# Patient Record
Sex: Female | Born: 1980 | Hispanic: Yes | Smoking: Never smoker
Health system: Southern US, Community
[De-identification: ages and names within clinical notes are randomized; demographics above are authoritative.]

## PROBLEM LIST (undated history)

## (undated) DIAGNOSIS — O1490 Unspecified pre-eclampsia, unspecified trimester: Secondary | ICD-10-CM

## (undated) DIAGNOSIS — D649 Anemia, unspecified: Secondary | ICD-10-CM

## (undated) HISTORY — DX: Anemia, unspecified: D64.9

## (undated) HISTORY — DX: Unspecified pre-eclampsia, unspecified trimester: O14.90

## (undated) HISTORY — PX: NO PAST SURGERIES: SHX2092

---
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
## 2004-04-18 ENCOUNTER — Emergency Department: Payer: Self-pay | Admitting: Emergency Medicine

## 2006-04-18 DIAGNOSIS — E282 Polycystic ovarian syndrome: Secondary | ICD-10-CM

## 2006-04-18 DIAGNOSIS — N926 Irregular menstruation, unspecified: Secondary | ICD-10-CM

## 2006-04-18 HISTORY — DX: Irregular menstruation, unspecified: N92.6

## 2006-04-18 HISTORY — DX: Polycystic ovarian syndrome: E28.2

## 2007-01-28 DIAGNOSIS — B009 Herpesviral infection, unspecified: Secondary | ICD-10-CM

## 2007-01-28 HISTORY — DX: Herpesviral infection, unspecified: B00.9

## 2008-08-23 ENCOUNTER — Inpatient Hospital Stay: Payer: Self-pay

## 2009-11-29 ENCOUNTER — Emergency Department: Payer: Self-pay | Admitting: Internal Medicine

## 2011-07-16 IMAGING — CR DG CHEST 1V PORT
1 series · 1 of 1 positions shown · non-contrast
Comparison: none

REASON FOR EXAM: cp
COMMENTS:

[view not recorded]
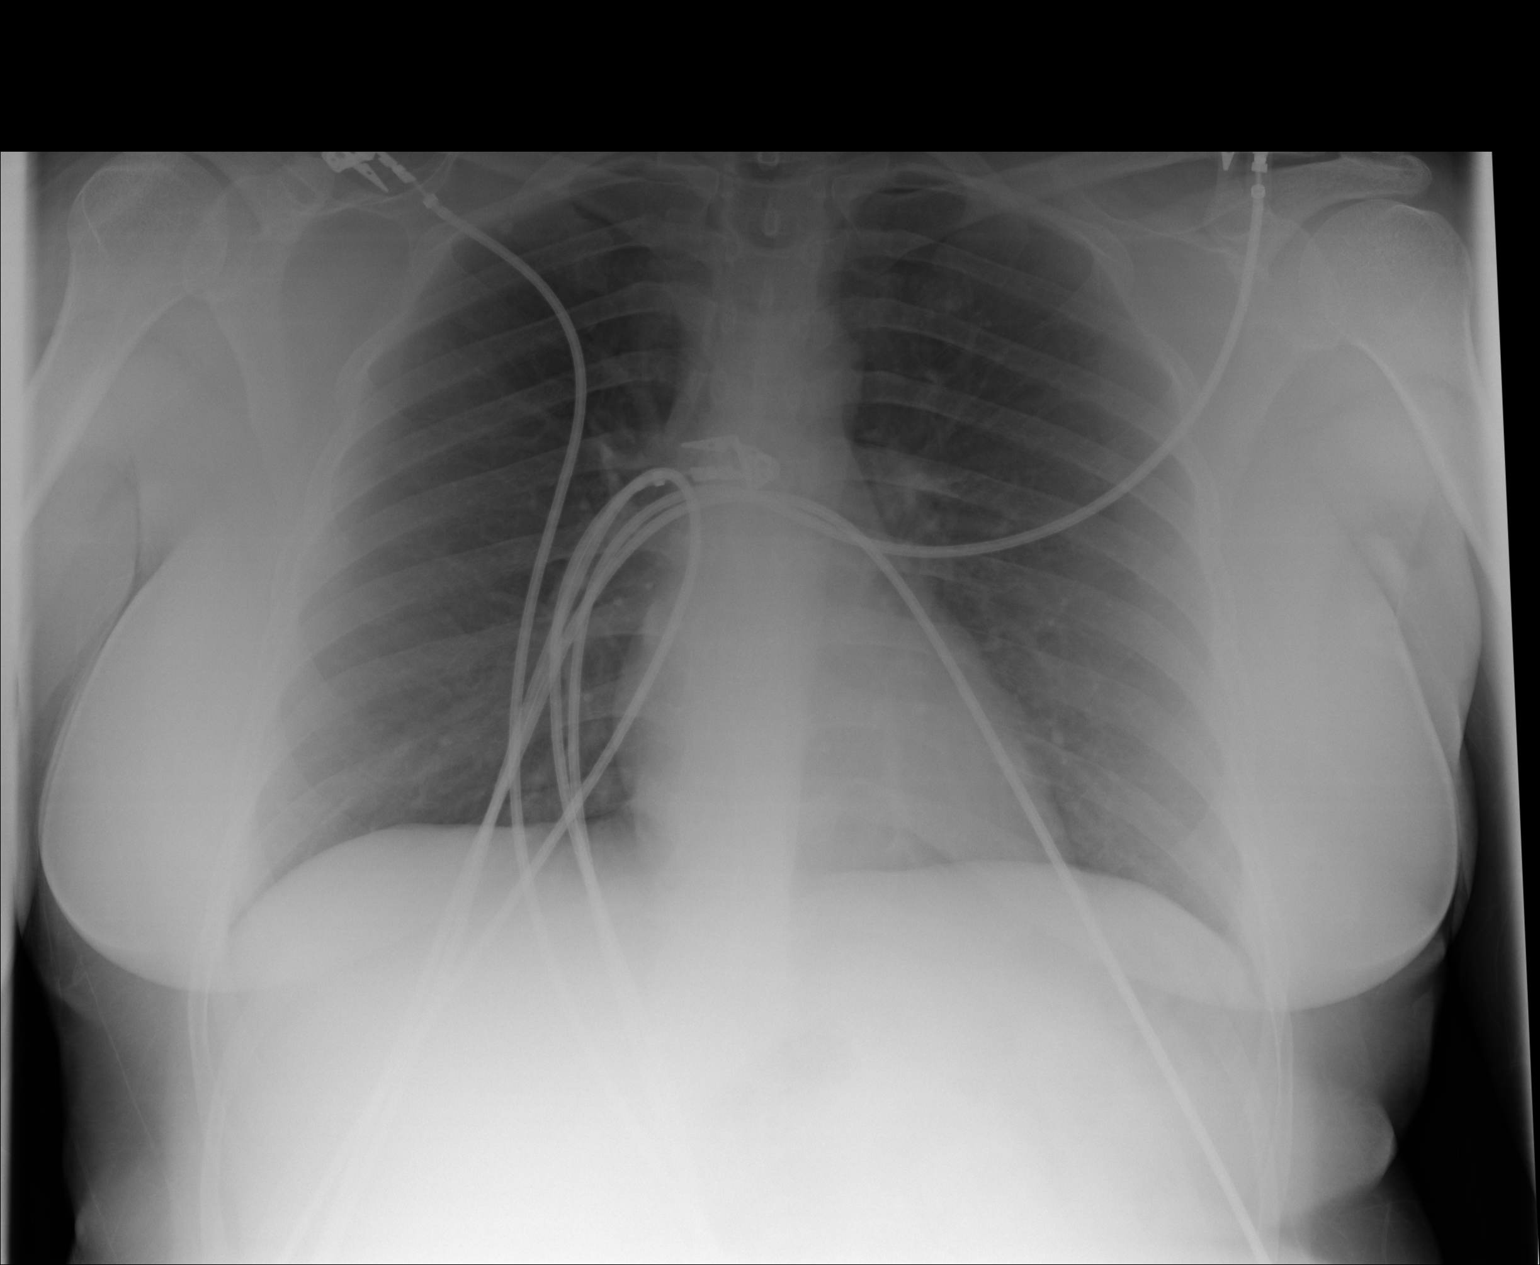

[1 of 1 positions shown; findings below may reference images not displayed]

PROCEDURE:     DXR - DXR PORTABLE CHEST SINGLE VIEW  - November 29, 2009  [DATE]

RESULT:     The lungs are adequately inflated. There is no focal infiltrate.
The mediastinum is not widened. The cardiac silhouette is normal in size.
The pulmonary vascularity is not engorged. There are cardiac monitoring
leads present.
IMPRESSION: I do not see evidence of acute cardiopulmonary abnormality.

## 2012-07-30 ENCOUNTER — Ambulatory Visit: Payer: Self-pay | Admitting: Nurse Practitioner

## 2012-10-05 ENCOUNTER — Encounter: Payer: Self-pay | Admitting: Maternal and Fetal Medicine

## 2012-10-26 ENCOUNTER — Encounter: Payer: Self-pay | Admitting: Obstetrics & Gynecology

## 2012-10-27 ENCOUNTER — Encounter: Payer: Self-pay | Admitting: Obstetrics & Gynecology

## 2013-03-02 ENCOUNTER — Inpatient Hospital Stay: Payer: Self-pay | Admitting: Obstetrics and Gynecology

## 2013-03-02 LAB — CBC WITH DIFFERENTIAL/PLATELET
Basophil #: 0.1 10*3/uL (ref 0.0–0.1)
HCT: 31.2 % — ABNORMAL LOW (ref 35.0–47.0)
Lymphocyte #: 1.8 10*3/uL (ref 1.0–3.6)
Lymphocyte %: 17.6 %
MCHC: 33.5 g/dL (ref 32.0–36.0)
MCV: 74 fL — ABNORMAL LOW (ref 80–100)
Monocyte #: 0.7 x10 3/mm (ref 0.2–0.9)
Monocyte %: 6.7 %
Neutrophil #: 7.6 10*3/uL — ABNORMAL HIGH (ref 1.4–6.5)
Neutrophil %: 74.3 %
Platelet: 293 10*3/uL (ref 150–440)
RBC: 4.22 10*6/uL (ref 3.80–5.20)
RDW: 14.7 % — ABNORMAL HIGH (ref 11.5–14.5)
WBC: 10.2 10*3/uL (ref 3.6–11.0)

## 2013-03-03 LAB — HEMOGLOBIN: HGB: 9.8 g/dL — ABNORMAL LOW (ref 12.0–16.0)

## 2014-06-08 DIAGNOSIS — E669 Obesity, unspecified: Secondary | ICD-10-CM

## 2014-06-08 HISTORY — DX: Obesity, unspecified: E66.9

## 2018-10-27 DIAGNOSIS — R7303 Prediabetes: Secondary | ICD-10-CM

## 2018-10-27 HISTORY — DX: Prediabetes: R73.03

## 2019-04-02 ENCOUNTER — Other Ambulatory Visit: Payer: Self-pay

## 2019-04-02 DIAGNOSIS — Z20822 Contact with and (suspected) exposure to covid-19: Secondary | ICD-10-CM

## 2019-04-03 LAB — NOVEL CORONAVIRUS, NAA: SARS-CoV-2, NAA: NOT DETECTED

## 2020-04-29 NOTE — L&D Delivery Note (Signed)
Delivery Note  Emily [name] is a [id number] at [redacted]w[redacted]d dated by ultrasound at [redacted]w[redacted]d  First Stage: Labor onset: 0330 Augmentation: oxytocin and AROM Analgesia /Anesthesia intrapartum: none AROM at 1345  Second Stage: Complete dilation at 1529 Onset of pushing at 1529 FHR second stage category 2 with repetitive small variables with quick return to baseline, moderate variability, overall reassuring  Delivery of a viable baby boy on 04/17/2021  at 1529 by Chari Manning CNM Delivery of fetal head in OA position with restitution to LOT. tight nuchal cord and body cord;  Anterior then posterior shoulders delivered easily with gentle downward traction. Baby placed on mom's chest, and attended to by baby RN Cord double clamped after cessation of pulsation, cut by sister of baby  Cord blood sample collection: Yes O POS Performed at [facility], [address] [place], [place] [address]  Collection of cord blood donation N/A Arterial cord blood sample no  Third Stage: Oxytocin bolus started after delivery of infant for hemorrhage prophylaxis  Placenta delivered [name] intact with 3 VC @ 1535 Placenta disposition: discarded Uterine tone firm / bleeding small  no laceration identified  Anesthesia for repair: N/A Repair N/A Est. Blood Loss (mL): 150  Complications: none  Mom to postpartum.  Baby to Couplet care / Skin to Skin.  Newborn: Information for the patient's newborn:  [name], [name] [[id number]]  Live born female  Birth Weight:   APGAR: 9, 9  Newborn Delivery   Birth date/time: 04/17/2021 15:29:00 Delivery type: Vaginal, Spontaneous        Feeding planned: Breast/Bottle  ---------- Chari Manning, CNM Certified Nurse Midwife [facility]

## 2020-05-08 DIAGNOSIS — E559 Vitamin D deficiency, unspecified: Secondary | ICD-10-CM

## 2020-05-08 HISTORY — DX: Vitamin D deficiency, unspecified: E55.9

## 2020-06-19 ENCOUNTER — Encounter: Payer: Self-pay | Admitting: Dermatology

## 2020-10-10 NOTE — Progress Notes (Signed)
Phone interview and OB abstract completed. Henriette Combs RN

## 2020-10-11 ENCOUNTER — Ambulatory Visit: Payer: Medicaid Other | Admitting: Advanced Practice Midwife

## 2020-10-11 ENCOUNTER — Other Ambulatory Visit: Payer: Self-pay

## 2020-10-11 VITALS — BP 108/73 | HR 71 | Temp 98.0°F | Ht 61.0 in | Wt 235.0 lb

## 2020-10-11 DIAGNOSIS — O0991 Supervision of high risk pregnancy, unspecified, first trimester: Secondary | ICD-10-CM

## 2020-10-11 DIAGNOSIS — O09291 Supervision of pregnancy with other poor reproductive or obstetric history, first trimester: Secondary | ICD-10-CM

## 2020-10-11 DIAGNOSIS — O161 Unspecified maternal hypertension, first trimester: Secondary | ICD-10-CM | POA: Diagnosis not present

## 2020-10-11 DIAGNOSIS — O99210 Obesity complicating pregnancy, unspecified trimester: Secondary | ICD-10-CM | POA: Insufficient documentation

## 2020-10-11 DIAGNOSIS — E282 Polycystic ovarian syndrome: Secondary | ICD-10-CM

## 2020-10-11 DIAGNOSIS — O09521 Supervision of elderly multigravida, first trimester: Secondary | ICD-10-CM

## 2020-10-11 DIAGNOSIS — O99211 Obesity complicating pregnancy, first trimester: Secondary | ICD-10-CM

## 2020-10-11 DIAGNOSIS — O09299 Supervision of pregnancy with other poor reproductive or obstetric history, unspecified trimester: Secondary | ICD-10-CM | POA: Insufficient documentation

## 2020-10-11 DIAGNOSIS — T7411XA Adult physical abuse, confirmed, initial encounter: Secondary | ICD-10-CM | POA: Insufficient documentation

## 2020-10-11 DIAGNOSIS — B009 Herpesviral infection, unspecified: Secondary | ICD-10-CM

## 2020-10-11 DIAGNOSIS — O0993 Supervision of high risk pregnancy, unspecified, third trimester: Secondary | ICD-10-CM | POA: Insufficient documentation

## 2020-10-11 DIAGNOSIS — O09529 Supervision of elderly multigravida, unspecified trimester: Secondary | ICD-10-CM | POA: Insufficient documentation

## 2020-10-11 LAB — WET PREP FOR TRICH, YEAST, CLUE
Trichomonas Exam: NEGATIVE
Yeast Exam: NEGATIVE

## 2020-10-11 LAB — URINALYSIS
Bilirubin, UA: NEGATIVE
Nitrite, UA: NEGATIVE
Protein,UA: NEGATIVE
RBC, UA: NEGATIVE
Specific Gravity, UA: 1.025 (ref 1.005–1.030)
Urobilinogen, Ur: 0.2 mg/dL (ref 0.2–1.0)
pH, UA: 6.5 (ref 5.0–7.5)

## 2020-10-11 LAB — HEMOGLOBIN, FINGERSTICK: Hemoglobin: 12.8 g/dL (ref 11.1–15.9)

## 2020-10-11 NOTE — Progress Notes (Signed)
Hgb = 12.8. Reviewed. Wet prep reviewed. WNL Urinalysis reviewed. No treatment indicated.   Reviewed with provider, Hazle Coca, CNM during clinic visit.   QTF gold lab not needed. PPD negative on 06/18/16 per paper chart. No international travel since.  Genetic counseling order received by Hazle Coca, CNM today and faxed today to [facility]FM genetic counseling.Fax confirmation received.    [name], RN

## 2020-10-11 NOTE — Progress Notes (Signed)
[facility] HEALTH DEPT [facility] [address] [place] [address] [phone]  INITIAL PRENATAL VISIT NOTE  Subjective:  Emily [name] is a 40 y.o. SHF nonsmoker G5P4004 ([id number], 7) at [redacted]w[redacted]d being seen today to start prenatal care at the [facility] Department. She feels "fine" about surprise pregnancy with no birth control x 8 years. She is not sure how old FOB is (36 or 10?); employed FOB feels "happy" about pregnancy; they share her 2 youngest children.  She is unsure of LMP ? 05/2020? She is not working and living with FOB, her 4 children, and her mother. She denies cigs, vaping, cigars, MJ. Last ETOH 04/22/20 (1 shot Tequila) 1x/mo. Began prenatal care at [facility] and has had 1 u/s this pregnancy on 09/06/20 at 7 1/7. Last pap 11/02/2018 neg.  2002 pap ASCUS. Denies ER use this pregnancy. She is currently monitored for the following issues for this high-risk pregnancy and has Advanced maternal age in multigravida 40 yo; Obesity affecting pregnancy BMI=44.4; Supervision of high risk pregnancy in first trimester; IUGR (intrauterine growth restriction) in prior pregnancy with IOL for IUGR 04/19/2000 5 lbs ; and Elevated blood pressure affecting pregnancy in first trimester on 08/24/20 137/79 on their problem list.  Patient reports no complaints.  Contractions: Not present. Vag. Bleeding: None.  Movement: Absent. Denies leaking of fluid.   Indications for ASA therapy (per uptodate) One of the following: Previous pregnancy with preeclampsia, especially early onset and with an adverse outcome No Multifetal gestation No Chronic hypertension No Type 1 or 2 diabetes mellitus No Chronic kidney disease No Autoimmune disease (antiphospholipid syndrome, systemic lupus erythematosus) No  Two or more of the following: Nulliparity No Obesity (body mass index >30 kg/m2) Yes Family history of preeclampsia in mother or sister  No Age ?35 years Yes Sociodemographic characteristics (African American race, low socioeconomic level) No Personal risk factors (eg, previous pregnancy with low birth weight or small for gestational age infant, previous adverse pregnancy outcome [eg, stillbirth], interval >10 years between pregnancies) Yes   The following portions of the patient's history were reviewed and updated as appropriate: allergies, current medications, past family history, past medical history, past social history, past surgical history and problem list. Problem list updated.  Objective:   Vitals:   10/11/20 0835 10/11/20 0856  BP:  108/73  Pulse:  71  Temp:  98 F (36.7 C)  Weight:  235 lb (106.6 kg)  Height: 5\' 1"  (1.549 m)     Fetal Status: Fetal Heart Rate (bpm): not heard Fundal Height: 0 cm Movement: Absent      Physical Exam Vitals and nursing note reviewed.  Constitutional:      General: She is not in acute distress.    Appearance: Normal appearance. She is well-developed. She is obese.  HENT:     Head: Normocephalic and atraumatic.     Right Ear: External ear normal.     Left Ear: External ear normal.     Nose: Nose normal. No congestion or rhinorrhea.     Mouth/Throat:     Lips: Pink.     Mouth: Mucous membranes are moist.     Dentition: Normal dentition. No dental caries.     Pharynx: Oropharynx is clear. Uvula midline.     Comments: Dentition: last dental exam 7 years ago; urged dental exam asap. No visible caries seen Eyes:     General: No scleral icterus.    Conjunctiva/sclera: Conjunctivae  normal.  Neck:     Thyroid: No thyroid mass or thyroid tenderness.  Cardiovascular:     Rate and Rhythm: Normal rate.     Pulses: Normal pulses.     Comments: Extremities are warm and well perfused Pulmonary:     Effort: Pulmonary effort is normal.     Breath sounds: Normal breath sounds.  Chest:     Chest wall: No mass.  Breasts:    Tanner Score is 5.     Breasts are symmetrical.      Right: Normal. No mass, nipple discharge, skin change or axillary adenopathy.     Left: Normal. No mass, nipple discharge, skin change or axillary adenopathy.  Abdominal:     Palpations: Abdomen is soft.     Tenderness: There is no abdominal tenderness. There is no right CVA tenderness, left CVA tenderness or guarding.     Comments: Gravid soft without masses or tenderness, increased adipose, poor tone, unable to assess uterine size and unable to hear FHR  Genitourinary:    General: Normal vulva.     Exam position: Lithotomy position.     Pubic Area: No rash.      Labia:        Right: No rash.        Left: No rash.      Vagina: Normal. No vaginal discharge (white creamy leukorrhea, ph<4.5).     Cervix: Normal.     Uterus: Normal. Enlarged (Gravid unable to assess size due to increased adipose). Not tender.      Rectum: Normal. No external hemorrhoid.     Comments: Unable to assess ovaries due to increased adipose Musculoskeletal:     Right lower leg: No edema.     Left lower leg: No edema.  Lymphadenopathy:     Cervical: No cervical adenopathy.     Upper Body:     Right upper body: No axillary adenopathy.     Left upper body: No axillary adenopathy.  Skin:    General: Skin is warm.     Capillary Refill: Capillary refill takes less than 2 seconds.  Neurological:     Mental Status: She is alert.    Assessment and Plan:  Pregnancy: G5P4004 at [redacted]w[redacted]d  1. Antepartum multigravida of advanced maternal age 80 yo Desires genetic counseling and Quad screen  2. Obesity affecting pregnancy, antepartum Early glucola today Desires ASA 81 mg daily to begin today Counseled on 11-20 lb wt gain in pregnancy - Glucose, 1 hour gestational - Hgb A1c w/o eAG - Comprehensive metabolic panel - Protein / creatinine ratio, urine  (Spot) - TSH  3. Supervision of high risk pregnancy in first trimester ROI for 03/2000 SGA infant--need correct gestational age   - HCV Ab w Reflex to Quant PCR -  Urine Culture - Chlamydia/GC NAA, Confirmation - [id number] Drug Screen - WET PREP FOR TRICH, YEAST, CLUE - Urinalysis (Urine Dip) - Hemoglobin, venipuncture - Prenatal profile without Varicella/Rubella ([id number]) - HIV-1/HIV-2 Qualitative RNA  4. IUGR (intrauterine growth restriction) in prior pregnancy with IOL for IUGR 04/19/2000 5 lbs    5. Elevated blood pressure affecting pregnancy in first trimester on 08/24/20 137/79 BP wnl today     Discussed overview of care and coordination with inpatient delivery practices including [facility], [facility], [facility] and [facility].   Reviewed Centering pregnancy as standard of care at [facility]   Preterm labor symptoms and general obstetric precautions including but not limited to vaginal bleeding, contractions, leaking of  fluid and fetal movement were reviewed in detail with the patient.  Please refer to After Visit Summary for other counseling recommendations.   No follow-ups on file.  No future appointments.  Alberteen Spindle, CNM

## 2020-10-12 ENCOUNTER — Telehealth: Payer: Self-pay

## 2020-10-12 LAB — 789231 7+OXYCODONE-BUND
Amphetamines, Urine: NEGATIVE ng/mL
BENZODIAZ UR QL: NEGATIVE ng/mL
Barbiturate screen, urine: NEGATIVE ng/mL
Cannabinoid Quant, Ur: NEGATIVE ng/mL
Cocaine (Metab.): NEGATIVE ng/mL
OPIATE SCREEN URINE: NEGATIVE ng/mL
Oxycodone/Oxymorphone, Urine: NEGATIVE ng/mL
PCP Quant, Ur: NEGATIVE ng/mL

## 2020-10-12 LAB — PROTEIN / CREATININE RATIO, URINE
Creatinine, Urine: 136.9 mg/dL
Protein, Ur: 10.6 mg/dL
Protein/Creat Ratio: 77 mg/g creat (ref 0–200)

## 2020-10-12 NOTE — Telephone Encounter (Signed)
Contacted patient to her know of upcoming appointment for genetic counseling [facility] on 6/23 at 0900. Patient aware to arrive at least 15 mins early to appointment.   Patient agreed with plan of care.   [name], RN

## 2020-10-13 LAB — CBC/D/PLT+RPR+RH+ABO+AB SCR
Antibody Screen: NEGATIVE
Basophils Absolute: 0 10*3/uL (ref 0.0–0.2)
Basos: 1 %
EOS (ABSOLUTE): 0.2 10*3/uL (ref 0.0–0.4)
Eos: 2 %
Hematocrit: 39.5 % (ref 34.0–46.6)
Hemoglobin: 12.9 g/dL (ref 11.1–15.9)
Hepatitis B Surface Ag: NEGATIVE
Immature Grans (Abs): 0 10*3/uL (ref 0.0–0.1)
Immature Granulocytes: 0 %
Lymphocytes Absolute: 1.8 10*3/uL (ref 0.7–3.1)
Lymphs: 23 %
MCH: 27.9 pg (ref 26.6–33.0)
MCHC: 32.7 g/dL (ref 31.5–35.7)
MCV: 86 fL (ref 79–97)
Monocytes Absolute: 0.3 10*3/uL (ref 0.1–0.9)
Monocytes: 4 %
Neutrophils Absolute: 5.3 10*3/uL (ref 1.4–7.0)
Neutrophils: 70 %
Platelets: 243 10*3/uL (ref 150–450)
RBC: 4.62 x10E6/uL (ref 3.77–5.28)
RDW: 13.6 % (ref 11.7–15.4)
RPR Ser Ql: NONREACTIVE
Rh Factor: POSITIVE
WBC: 7.6 10*3/uL (ref 3.4–10.8)

## 2020-10-13 LAB — HCV AB W REFLEX TO QUANT PCR: HCV Ab: <0.1 s/co ratio (ref 0.0–0.9)

## 2020-10-13 LAB — HIV-1/HIV-2 QUALITATIVE RNA
HIV-1 RNA, Qualitative: NONREACTIVE
HIV-2 RNA, Qualitative: NONREACTIVE

## 2020-10-13 LAB — COMPREHENSIVE METABOLIC PANEL
ALT: 11 IU/L (ref 0–32)
AST: 14 IU/L (ref 0–40)
Albumin/Globulin Ratio: 1.2 (ref 1.2–2.2)
Albumin: 3.6 g/dL — ABNORMAL LOW (ref 3.8–4.8)
Alkaline Phosphatase: 53 IU/L (ref 44–121)
BUN/Creatinine Ratio: 11 (ref 9–23)
BUN: 6 mg/dL (ref 6–20)
Bilirubin Total: 0.6 mg/dL (ref 0.0–1.2)
CO2: 20 mmol/L (ref 20–29)
Calcium: 8.9 mg/dL (ref 8.7–10.2)
Chloride: 100 mmol/L (ref 96–106)
Creatinine, Ser: 0.53 mg/dL — ABNORMAL LOW (ref 0.57–1.00)
Globulin, Total: 2.9 g/dL (ref 1.5–4.5)
Glucose: 139 mg/dL — ABNORMAL HIGH (ref 65–99)
Potassium: 3.5 mmol/L (ref 3.5–5.2)
Sodium: 135 mmol/L (ref 134–144)
Total Protein: 6.5 g/dL (ref 6.0–8.5)
eGFR: 121 mL/min/{1.73_m2} (ref >59)

## 2020-10-13 LAB — TSH: TSH: 0.998 u[IU]/mL (ref 0.450–4.500)

## 2020-10-13 LAB — HGB A1C W/O EAG: Hgb A1c MFr Bld: 5.7 % — ABNORMAL HIGH (ref 4.8–5.6)

## 2020-10-13 LAB — HCV INTERPRETATION

## 2020-10-13 LAB — GLUCOSE, 1 HOUR GESTATIONAL: Gestational Diabetes Screen: 134 mg/dL (ref 65–139)

## 2020-10-14 LAB — URINE CULTURE

## 2020-10-14 LAB — CHLAMYDIA/GC NAA, CONFIRMATION
Chlamydia trachomatis, NAA: NEGATIVE
Neisseria gonorrhoeae, NAA: NEGATIVE

## 2020-10-17 ENCOUNTER — Other Ambulatory Visit: Payer: Self-pay

## 2020-10-17 ENCOUNTER — Ambulatory Visit: Payer: Medicaid Other | Attending: Maternal & Fetal Medicine

## 2020-10-17 VITALS — BP 106/65 | HR 82 | Temp 98.3°F | Ht 61.0 in | Wt 236.5 lb

## 2020-10-17 DIAGNOSIS — O09521 Supervision of elderly multigravida, first trimester: Secondary | ICD-10-CM | POA: Diagnosis not present

## 2020-10-17 DIAGNOSIS — Z3A00 Weeks of gestation of pregnancy not specified: Secondary | ICD-10-CM | POA: Diagnosis not present

## 2020-10-17 NOTE — Progress Notes (Signed)
Referring Provider:  [facility] Department Length of Consultation: 40 minutes  [name] was referred to Maternal Fetal Care at [facility] for genetic counseling because of advanced maternal age.  The patient will be 40 years old at the time of delivery.  This note summarizes the information we discussed.  The patient was present at this visit alone.  We explained that the chance of a chromosome abnormality increases with maternal age.  Chromosomes and examples of chromosome problems were reviewed.  Humans typically have 46 chromosomes in each cell, with half passed through each sperm and egg.  Any change in the number or structure of chromosomes can increase the risk of problems in the physical and mental development of a pregnancy.   Based upon age of the patient, the chance of any chromosome abnormality was 1 in 31. The chance of Down syndrome, the most common chromosome problem associated with maternal age, was 1 in 8.  The risk of chromosome problems is in addition to the 3% general population risk for birth defects and intellectual disabilities.  The greatest chance, of course, is that the baby would be born in good health.  We discussed the following prenatal screening and testing options for this pregnancy:  Cell free fetal DNA testing analyzes maternal blood to determine whether or not the baby may have Down syndrome, trisomy 77, or trisomy 58.  This test utilizes a maternal blood sample and DNA sequencing technology to isolate circulating cell free fetal DNA from maternal plasma.  The fetal DNA can then be analyzed for DNA sequences that are derived from the three most common chromosomes involved in aneuploidy, chromosomes 13, 18, and 21.  If the overall amount of DNA is greater than the expected level for any of these chromosomes, aneuploidy is suspected.  While we do not consider it a replacement for invasive testing and karyotype analysis, a negative result from  this testing would be reassuring, though not a guarantee of a normal chromosome complement for the baby.  An abnormal result is certainly suggestive of an abnormal chromosome complement, though we would still recommend CVS or amniocentesis to confirm any findings from this testing.  First trimester screening, is another blood test which includes nuchal translucency ultrasound screen and first trimester maternal serum marker screening.  The nuchal translucency has approximately an 80% detection rate for Down syndrome and can be positive for other chromosome abnormalities as well as heart defects.  When combined with a maternal serum marker screening, the detection rate is up to 90% for Down syndrome and up to 97% for trisomy 18.     The chorionic villus sampling procedure is available for first trimester chromosome analysis.  This involves the withdrawal of a small amount of chorionic villi (tissue from the developing placenta).  Risk of pregnancy loss is estimated to be approximately 1 in 200 to 1 in 100 (0.5 to 1%).  There is approximately a 1% (1 in 100) chance that the CVS chromosome results will be unclear.  Chorionic villi cannot be tested for neural tube defects.     Maternal serum marker screening, a blood test that measures pregnancy proteins, can provide risk assessments for Down syndrome, trisomy 18, and open neural tube defects (spina bifida, anencephaly). Because it does not directly examine the fetus, it cannot positively diagnose or rule out these problems. The detection rate is approximately 75% for Down syndrome, 70% for trisomy 18 and 80% of open neural tube defects. If prior chromosome  screening has been performed, then AFP only is recommended to test for open neural tube defects alone.  Targeted ultrasound uses high frequency sound waves to create an image of the developing fetus.  An ultrasound is often recommended as a routine means of evaluating the pregnancy.  It is also used to screen  for fetal anatomy problems (for example, a heart defect) that might be suggestive of a chromosomal or other abnormality.   Amniocentesis involves the removal of a small amount of amniotic fluid from the sac surrounding the fetus with the use of a thin needle inserted through the maternal abdomen and uterus.  Ultrasound guidance is used throughout the procedure.  Fetal cells from amniotic fluid are directly evaluated and > 99.5% of chromosome problems and > 98% of open neural tube defects can be detected. This procedure is generally performed after the 15th week of pregnancy.  The main risks to this procedure include complications leading to miscarriage in less than 1 in 200 cases (0.5%).  Cystic Fibrosis and Spinal Muscular Atrophy (SMA) screening were also discussed with the patient. Both conditions are recessive, which means that both parents must be carriers in order to have a child with the disease.  Cystic fibrosis (CF) is one of the most common genetic conditions in persons of Caucasian ancestry.  This condition occurs in approximately 1 in 2,500 Caucasian persons and results in thickened secretions in the lungs, digestive, and reproductive systems.  For a baby to be at risk for having CF, both of the parents must be carriers for this condition.  Approximately 1 in 60 Caucasian persons is a carrier for CF.  Current carrier testing looks for the most common mutations in the gene for CF and can detect approximately 90% of carriers in the Caucasian population.  This means that the carrier screening can greatly reduce, but cannot eliminate, the chance for an individual to have a child with CF.  If an individual is found to be a carrier for CF, then carrier testing would be available for the partner. As part of [place]'s newborn screening profile, all babies born in the state of [place] will have a two-tier screening process.  Specimens are first tested to determine the concentration of  immunoreactive trypsinogen (IRT).  The top 5% of specimens with the highest IRT values then undergo DNA testing using a panel of over 40 common CF mutations. SMA is a neurodegenerative disorder that leads to atrophy of skeletal muscle and overall weakness.  This condition is also more prevalent in the Caucasian population, with 1 in 40-1 in 60 persons being a carrier and 1 in 6,000-1 in 10,000 children being affected.  There are multiple forms of the disease, with some causing death in infancy to other forms with survival into adulthood.  The genetics of SMA is complex, but carrier screening can detect up to 95% of carriers in the Caucasian population.  Similar to CF, a negative result can greatly reduce, but cannot eliminate, the chance to have a child with SMA.  Hemoglobinopathy screening was also offered to the patient.  We obtained a detailed family history and pregnancy history.  This is the fifth pregnancy for this patient, the third with her current partner.  She has a healthy 29 year old daughter and 49 year old son from prior relationships. The daughter was delivered early due to IUGR and weighed 5 pounds, though the patient was unsure of the gestational age at that delivery.  She and her  current partner have two sons, ages 42 and 7 years.  The 54 year old has health complications including sleep apnea, snoring and possible diabetes due to obesity. The remainder of the family history is unremarkable for birth defects, developmental delays, recurrent pregnancy loss or known chromosome abnormalities.  [name] reported no complications in this pregnancy.  She reported taking no medications other than prenatal vitamins and has had no exposure to alcohol, tobacco or recreational drugs.  Plan of care: [facility] with SCA testing drawn today. Carrier screening for CF, SMA and hemoglobinopathies drawn today. Ultrasound for fetal anatomy scheduled for 11/21/20 at 4pm (presumptive Medicaid ends on  7/31). Prior ultrasound for dating at [facility] on 09/06/20 at [redacted]w[redacted]d. We attempted to add an ultrasound for first trimester anatomy today, there was no availability.   [name] was encouraged to call with questions or concerns.  We can be contacted at [phone].    Cherly Anderson, MS, CGC

## 2020-10-19 ENCOUNTER — Ambulatory Visit: Payer: Self-pay

## 2020-10-19 LAB — HGB FRACTIONATION CASCADE
Hgb A2: 2.7 % (ref 1.8–3.2)
Hgb A: 97.3 % (ref 96.4–98.8)
Hgb F: 0 % (ref 0.0–2.0)
Hgb S: 0 %

## 2020-10-22 LAB — MATERNIT21 PLUS CORE+SCA
Fetal Fraction: 7
Monosomy X (Turner Syndrome): NOT DETECTED
Result (T21): NEGATIVE
Trisomy 13 (Patau syndrome): NEGATIVE
Trisomy 18 (Edwards syndrome): NEGATIVE
Trisomy 21 (Down syndrome): NEGATIVE
XXX (Triple X Syndrome): NOT DETECTED
XXY (Klinefelter Syndrome): NOT DETECTED
XYY (Jacobs Syndrome): NOT DETECTED

## 2020-10-23 NOTE — Addendum Note (Signed)
Addended by: Heywood Bene on: 10/23/2020 03:47 PM   Modules accepted: Orders

## 2020-10-25 LAB — MISC LABCORP TEST (SEND OUT): Labcorp test code: 452172

## 2020-10-26 ENCOUNTER — Telehealth: Payer: Self-pay | Admitting: Obstetrics and Gynecology

## 2020-10-26 NOTE — Telephone Encounter (Signed)
[name] elected to have carrier screening for CF, SMA and hemoglobopathies as well as cell free fetal DNA testing through [facility].  Results were communicated to the patient and are all within normal limites, with details below:  The patient was informed of the results of her recent [facility] testing which yielded NEGATIVE results.  The patient's specimen showed DNA consistent with two copies of chromosomes 21, 18 and 13.  The sensitivity for trisomy 42, trisomy 1 and trisomy 58 using this testing are reported as 99.1%, 99.9% and 91.7% respectively.  Thus, while the results of this testing are highly accurate, they are not considered diagnostic at this time.  Should more definitive information be desired, the patient may still consider amniocentesis.   As requested to know by the patient, sex chromosome analysis was included for this sample.  Results are consistent with a female (XY) fetus. This is predicted with >99% accuracy. This testing also screens for sex chromosome conditions with greater than 96% accuracy and was negative for those conditions.   A maternal serum AFP only should be considered if screening for neural tube defects is desired.  To review, CF is a genetic condition that occurs most often in Caucasian persons.  It primarily affects the lungs, digestive, and reproductive systems.  For someone to be at risk for having CF, both of their parents must be carriers for CF.  The testing can detect many persons who are carriers for CF and therefore determine if the pregnancy is at an increased risk for this condition.   The blood test results were negative when examined for the 97 most common mutations (or changes) in the gene for CF.  Testing for these 97 mutations detects approximately 78% of carriers who are Hispanic.  Therefore, the chance that she is a carrier based on this negative result has been reduced from 1 in 58 to approximately 1 in 260.  Because this testing cannot  detect all changes that may cause CF, we cannot eliminate the chance that this individual is a carrier completely.  The results of the SMA carrier screening are also negative.  SMA is also a recessive genetic condition with variable age of onset and severity caused by mutations in the SMN1 gene.  This carrier testing assesses the number of copies of this gene.  Persons with one copy of the SMN1 gene are carriers, and those with no copies are affected with the condition.  Individuals with two or more copies have a reduced chance to be a carrier.  Not all mutations can be detected with this testing, though it can detect 95% of carriers in the Caucasian population.  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 *3+80T>G SNP, thus reducing her chance to be a carrier from 1 in 20 to 1 in 906.  Again, this testing cannot eliminate the chance to have a child with SMA, but dramatically reduces the chance.    Lastly, screening for hemoglobinopathies including sickle cell disease was performed and showed normal adult hemoglobin (AA) with a normal MCV on her most recent CBC, which greatly reduces the chance for her to be a carrier for an inherited anemia.  If there are any questions or concerns, please feel free to contact our office at [phone].   [email or web address]

## 2020-11-08 ENCOUNTER — Other Ambulatory Visit: Payer: Self-pay

## 2020-11-08 ENCOUNTER — Ambulatory Visit: Payer: Medicaid Other | Admitting: Family Medicine

## 2020-11-08 VITALS — BP 97/59 | HR 81 | Temp 97.2°F | Wt 238.6 lb

## 2020-11-08 DIAGNOSIS — O0992 Supervision of high risk pregnancy, unspecified, second trimester: Secondary | ICD-10-CM | POA: Diagnosis not present

## 2020-11-08 DIAGNOSIS — O99212 Obesity complicating pregnancy, second trimester: Secondary | ICD-10-CM

## 2020-11-08 DIAGNOSIS — O0991 Supervision of high risk pregnancy, unspecified, first trimester: Secondary | ICD-10-CM

## 2020-11-08 DIAGNOSIS — O99210 Obesity complicating pregnancy, unspecified trimester: Secondary | ICD-10-CM

## 2020-11-08 MED ORDER — PRENATAL VITAMINS 28-0.8 MG PO TABS: 1.0000 | ORAL_TABLET | Freq: Every day | ORAL | 0 refills | Status: AC

## 2020-11-08 NOTE — Addendum Note (Signed)
Addended by: Tawny Hopping A on: 11/08/2020 11:52 AM   Modules accepted: Orders

## 2020-11-08 NOTE — Progress Notes (Addendum)
Here today for 16.1 week MH RV. Taking PNV QD. Denies ED/hospital visits since last RV. Has MFM anatomy scan scheduled for 11/21/2020 @ 4:00, patient is aware. AFP today. PNV's given. Tawny Hopping, RN
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
## 2020-11-10 LAB — AFP TETRA
DIA Mom Value: 0.69
DIA Value (EIA): 81.94 pg/mL
DSR (By Age)    1 IN: 130
DSR (Second Trimester) 1 IN: 4673
Gestational Age: 16.1 WEEKS
MSAFP Mom: 1.07
MSAFP: 28.6 ng/mL
MSHCG Mom: 0.35
MSHCG: 10702 m[IU]/mL
Maternal Age At EDD: 38.5 yr
Osb Risk: 9862
T18 (By Age): 1:505 {titer}
Test Results:: NEGATIVE
Weight: 239 [lb_av]
uE3 Mom: 1.28
uE3 Value: 1.07 ng/mL

## 2020-11-21 ENCOUNTER — Other Ambulatory Visit: Payer: Self-pay

## 2020-11-21 ENCOUNTER — Other Ambulatory Visit: Payer: Self-pay | Admitting: Maternal & Fetal Medicine

## 2020-11-21 ENCOUNTER — Ambulatory Visit: Payer: Medicaid Other | Attending: Maternal & Fetal Medicine

## 2020-11-21 VITALS — BP 111/63 | HR 101 | Temp 98.1°F | Resp 18 | Ht 62.0 in | Wt 241.5 lb

## 2020-11-21 DIAGNOSIS — O09292 Supervision of pregnancy with other poor reproductive or obstetric history, second trimester: Secondary | ICD-10-CM | POA: Diagnosis not present

## 2020-11-21 DIAGNOSIS — E669 Obesity, unspecified: Secondary | ICD-10-CM | POA: Diagnosis not present

## 2020-11-21 DIAGNOSIS — O09522 Supervision of elderly multigravida, second trimester: Secondary | ICD-10-CM | POA: Diagnosis not present

## 2020-11-21 DIAGNOSIS — Z3A18 18 weeks gestation of pregnancy: Secondary | ICD-10-CM

## 2020-11-21 DIAGNOSIS — O99212 Obesity complicating pregnancy, second trimester: Secondary | ICD-10-CM | POA: Diagnosis not present

## 2020-11-21 DIAGNOSIS — O0992 Supervision of high risk pregnancy, unspecified, second trimester: Secondary | ICD-10-CM

## 2020-11-22 ENCOUNTER — Encounter: Payer: Self-pay | Admitting: Advanced Practice Midwife

## 2020-11-30 ENCOUNTER — Other Ambulatory Visit: Payer: Self-pay

## 2020-12-06 ENCOUNTER — Other Ambulatory Visit: Payer: Self-pay

## 2020-12-06 ENCOUNTER — Ambulatory Visit: Payer: Self-pay | Admitting: Advanced Practice Midwife

## 2020-12-06 VITALS — BP 116/75 | HR 90 | Temp 97.4°F | Wt 240.8 lb

## 2020-12-06 DIAGNOSIS — O09522 Supervision of elderly multigravida, second trimester: Secondary | ICD-10-CM

## 2020-12-06 DIAGNOSIS — O99210 Obesity complicating pregnancy, unspecified trimester: Secondary | ICD-10-CM

## 2020-12-06 DIAGNOSIS — O0992 Supervision of high risk pregnancy, unspecified, second trimester: Secondary | ICD-10-CM

## 2020-12-06 DIAGNOSIS — O0991 Supervision of high risk pregnancy, unspecified, first trimester: Secondary | ICD-10-CM

## 2020-12-06 DIAGNOSIS — O99212 Obesity complicating pregnancy, second trimester: Secondary | ICD-10-CM
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
## 2020-12-18 ENCOUNTER — Other Ambulatory Visit: Payer: Self-pay | Admitting: Maternal & Fetal Medicine

## 2020-12-18 DIAGNOSIS — Z8759 Personal history of other complications of pregnancy, childbirth and the puerperium: Secondary | ICD-10-CM

## 2020-12-18 DIAGNOSIS — O99212 Obesity complicating pregnancy, second trimester: Secondary | ICD-10-CM

## 2020-12-18 DIAGNOSIS — O09522 Supervision of elderly multigravida, second trimester: Secondary | ICD-10-CM

## 2020-12-21 ENCOUNTER — Ambulatory Visit: Payer: Medicaid Other | Attending: Maternal & Fetal Medicine

## 2020-12-21 ENCOUNTER — Other Ambulatory Visit: Payer: Self-pay

## 2020-12-21 VITALS — BP 127/83 | HR 79 | Temp 98.3°F | Resp 20 | Ht 62.0 in | Wt 242.0 lb

## 2020-12-21 DIAGNOSIS — O09522 Supervision of elderly multigravida, second trimester: Secondary | ICD-10-CM | POA: Diagnosis not present

## 2020-12-21 DIAGNOSIS — O99212 Obesity complicating pregnancy, second trimester: Secondary | ICD-10-CM | POA: Insufficient documentation

## 2020-12-21 DIAGNOSIS — Z3A22 22 weeks gestation of pregnancy: Secondary | ICD-10-CM | POA: Insufficient documentation

## 2020-12-21 DIAGNOSIS — O09292 Supervision of pregnancy with other poor reproductive or obstetric history, second trimester: Secondary | ICD-10-CM

## 2020-12-21 DIAGNOSIS — E669 Obesity, unspecified: Secondary | ICD-10-CM | POA: Diagnosis not present

## 2020-12-21 DIAGNOSIS — Z8759 Personal history of other complications of pregnancy, childbirth and the puerperium: Secondary | ICD-10-CM | POA: Insufficient documentation

## 2021-01-03 ENCOUNTER — Ambulatory Visit: Payer: Self-pay | Admitting: Advanced Practice Midwife

## 2021-01-03 ENCOUNTER — Other Ambulatory Visit: Payer: Self-pay

## 2021-01-03 VITALS — BP 112/68 | HR 86 | Temp 97.7°F | Wt 243.0 lb

## 2021-01-03 DIAGNOSIS — O0992 Supervision of high risk pregnancy, unspecified, second trimester: Secondary | ICD-10-CM

## 2021-01-03 DIAGNOSIS — O99212 Obesity complicating pregnancy, second trimester: Secondary | ICD-10-CM

## 2021-01-03 DIAGNOSIS — O99210 Obesity complicating pregnancy, unspecified trimester: Secondary | ICD-10-CM

## 2021-01-03 DIAGNOSIS — O09522 Supervision of elderly multigravida, second trimester: Secondary | ICD-10-CM

## 2021-01-03 DIAGNOSIS — O0991 Supervision of high risk pregnancy, unspecified, first trimester: Secondary | ICD-10-CM

## 2021-01-03 NOTE — Progress Notes (Signed)
Patient here for MH RV at 24 1/7. S/S PTL today, literature given and reviewed with patient. CMHRP forms and PHQ9 today.Burt Knack, RN
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
## 2021-01-04 ENCOUNTER — Telehealth: Payer: Self-pay

## 2021-01-04 NOTE — Telephone Encounter (Signed)
Patient called to say she and her children tested positive for covid 19 today. Patient asked if there is any special medicines she should take. Patient states she has body aches and no fever and denies other symptoms. Patient counseled to rest and drink plenty of fluids, and that she can take Tylenol to relieve body aches. Patient referred to "Safe Medications in Pregnancy" list from new OB packet. Patient counseled to stay home with her kids and to keep away from other people. Counseled to call her children's school to find out their covid protocol, patient states she has already done that. Patient denies further questions at this time. Interpreter, M. [place].Burt Knack, RN

## 2021-01-15 ENCOUNTER — Other Ambulatory Visit: Payer: Self-pay | Admitting: Maternal & Fetal Medicine

## 2021-01-15 DIAGNOSIS — O09522 Supervision of elderly multigravida, second trimester: Secondary | ICD-10-CM

## 2021-01-15 DIAGNOSIS — Z8759 Personal history of other complications of pregnancy, childbirth and the puerperium: Secondary | ICD-10-CM

## 2021-01-15 DIAGNOSIS — O99212 Obesity complicating pregnancy, second trimester: Secondary | ICD-10-CM

## 2021-01-18 ENCOUNTER — Ambulatory Visit: Payer: Self-pay | Attending: Obstetrics and Gynecology

## 2021-01-18 ENCOUNTER — Other Ambulatory Visit: Payer: Self-pay

## 2021-01-18 DIAGNOSIS — Z8759 Personal history of other complications of pregnancy, childbirth and the puerperium: Secondary | ICD-10-CM

## 2021-01-18 DIAGNOSIS — O09522 Supervision of elderly multigravida, second trimester: Secondary | ICD-10-CM

## 2021-01-18 DIAGNOSIS — E669 Obesity, unspecified: Secondary | ICD-10-CM

## 2021-01-18 DIAGNOSIS — O09293 Supervision of pregnancy with other poor reproductive or obstetric history, third trimester: Secondary | ICD-10-CM

## 2021-01-18 DIAGNOSIS — O99212 Obesity complicating pregnancy, second trimester: Secondary | ICD-10-CM

## 2021-01-18 DIAGNOSIS — Z3A26 26 weeks gestation of pregnancy: Secondary | ICD-10-CM

## 2021-01-31 ENCOUNTER — Ambulatory Visit: Payer: Self-pay | Admitting: Advanced Practice Midwife

## 2021-01-31 ENCOUNTER — Other Ambulatory Visit: Payer: Self-pay

## 2021-01-31 VITALS — BP 108/70 | HR 89 | Temp 97.1°F | Wt 248.0 lb

## 2021-01-31 DIAGNOSIS — O0993 Supervision of high risk pregnancy, unspecified, third trimester: Secondary | ICD-10-CM

## 2021-01-31 DIAGNOSIS — O99210 Obesity complicating pregnancy, unspecified trimester: Secondary | ICD-10-CM

## 2021-01-31 DIAGNOSIS — O99213 Obesity complicating pregnancy, third trimester: Secondary | ICD-10-CM

## 2021-01-31 DIAGNOSIS — O163 Unspecified maternal hypertension, third trimester: Secondary | ICD-10-CM

## 2021-01-31 DIAGNOSIS — O161 Unspecified maternal hypertension, first trimester: Secondary | ICD-10-CM

## 2021-01-31 DIAGNOSIS — O0991 Supervision of high risk pregnancy, unspecified, first trimester: Secondary | ICD-10-CM

## 2021-01-31 DIAGNOSIS — Z23 Encounter for immunization: Secondary | ICD-10-CM

## 2021-01-31 LAB — HEMOGLOBIN, FINGERSTICK: Hemoglobin: 11.8 g/dL (ref 11.1–15.9)

## 2021-01-31 NOTE — Progress Notes (Signed)
Hgb reviewed = WNL  [name], RN

## 2021-01-31 NOTE — Progress Notes (Signed)
Patient here for MH RV at 28w 1d.   28 week labs done today. Tdap given today. Patient aware of [place] [facility] OB F/U 11/17 at 11am.   [name], RN
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
## 2021-02-01 ENCOUNTER — Telehealth: Payer: Self-pay

## 2021-02-01 DIAGNOSIS — R7309 Other abnormal glucose: Secondary | ICD-10-CM | POA: Insufficient documentation

## 2021-02-01 LAB — GLUCOSE, 1 HOUR GESTATIONAL: Gestational Diabetes Screen: 159 mg/dL — ABNORMAL HIGH (ref 65–139)

## 2021-02-01 LAB — RPR: RPR Ser Ql: NONREACTIVE

## 2021-02-01 NOTE — Telephone Encounter (Signed)
TC to patient to inform of elevated glucose level at last appointment. 1 hour gtt = 159. Patient is scheduled to come for 3 hour gtt on 02/05/2021 as there are limited appointments in nurse clinic next week. LM with number to call. Patient needs date/time of test and fasting instructions.Burt Knack, RN

## 2021-02-02 LAB — HIV-1/HIV-2 QUALITATIVE RNA
HIV-1 RNA, Qualitative: NONREACTIVE
HIV-2 RNA, Qualitative: NONREACTIVE

## 2021-02-02 NOTE — Telephone Encounter (Signed)
Patient returned phone call this morning.   Patient was made aware of elevated 1 hr gtt on 01/30/21 of 159 and the need to follow up with a 3 hr gtt Monday at 0800.   Patient educated that this test on Monday will determine diagnoses of gestational diabetes and importance of appointment.   Patient states she will be here Monday 02/05/21 and that she will report to lab. Patient educated and verbalized understanding that she is to have nothing by mouth (no food, no drinks, no candy or gum) from 8pm on 02/01/21.  All questions answered.   [name], RN

## 2021-02-05 ENCOUNTER — Other Ambulatory Visit: Payer: Self-pay

## 2021-02-05 DIAGNOSIS — R7309 Other abnormal glucose: Secondary | ICD-10-CM

## 2021-02-05 NOTE — Progress Notes (Signed)
In Nurse Clinic for 3 hr GTT. Reports npo since 8 pm last night. Instructions given for test today. Reports understanding. Advised to notify if n/v. Next [facility] appt 02/14/2021, pt aware. Jerel Shepherd, RN

## 2021-02-06 LAB — GLUCOSE TOLERANCE TEST, 6 HOUR
Glucose, 1 Hour GTT: 205 mg/dL — ABNORMAL HIGH (ref 70–199)
Glucose, 2 hour: 133 mg/dL (ref 70–139)
Glucose, 3 hour: 74 mg/dL (ref 70–109)
Glucose, GTT - Fasting: 103 mg/dL — ABNORMAL HIGH (ref 70–99)

## 2021-02-13 ENCOUNTER — Other Ambulatory Visit: Payer: Self-pay | Admitting: Family Medicine

## 2021-02-13 ENCOUNTER — Encounter: Payer: Self-pay | Admitting: Family Medicine

## 2021-02-13 DIAGNOSIS — O24415 Gestational diabetes mellitus in pregnancy, controlled by oral hypoglycemic drugs: Secondary | ICD-10-CM | POA: Insufficient documentation

## 2021-02-13 DIAGNOSIS — O24419 Gestational diabetes mellitus in pregnancy, unspecified control: Secondary | ICD-10-CM

## 2021-02-13 HISTORY — DX: Gestational diabetes mellitus in pregnancy, unspecified control: O24.419

## 2021-02-13 NOTE — Progress Notes (Signed)
Received call back  from [facility].  They have patient scheduled to be seen on 10/25 @ 3:40 pm and will dispense BG supplies.    Referral sent to [facility] for patient education.    Provider requesting labs and previous visits notes.  Pt is aware of appointment.    Wendi Snipes, FNP

## 2021-02-13 NOTE — Progress Notes (Signed)
-  Order for [facility] and MNT   Call to patient, informed of GDM, [facility] referral sent.   Pt PCP is [facility], pt does not have insurance or medicaid.  Call to [facility] to see if they would take RX for BG supplies from [facility].  They will reach out to patient for visit and have PCP write for patient supplies.  Will have paper RX sent from [facility] provider  Wendi Snipes, FNP

## 2021-02-13 NOTE — Progress Notes (Addendum)
Notes and glucose tolerance labs faxed to [facility] with confirmation, per Elveria Rising, FNP request. [facility] to provide glucometer, strips, lancets to patient and patient to have [facility] appointment.Burt Knack, RN

## 2021-02-14 ENCOUNTER — Ambulatory Visit: Payer: Self-pay | Admitting: Advanced Practice Midwife

## 2021-02-14 ENCOUNTER — Other Ambulatory Visit: Payer: Self-pay

## 2021-02-14 VITALS — BP 107/70 | HR 87 | Temp 97.2°F | Wt 248.6 lb

## 2021-02-14 DIAGNOSIS — O99210 Obesity complicating pregnancy, unspecified trimester: Secondary | ICD-10-CM

## 2021-02-14 DIAGNOSIS — O24419 Gestational diabetes mellitus in pregnancy, unspecified control: Secondary | ICD-10-CM

## 2021-02-14 DIAGNOSIS — O99213 Obesity complicating pregnancy, third trimester: Secondary | ICD-10-CM

## 2021-02-14 DIAGNOSIS — O09522 Supervision of elderly multigravida, second trimester: Secondary | ICD-10-CM

## 2021-02-14 DIAGNOSIS — O0993 Supervision of high risk pregnancy, unspecified, third trimester: Secondary | ICD-10-CM

## 2021-02-14 DIAGNOSIS — O09299 Supervision of pregnancy with other poor reproductive or obstetric history, unspecified trimester: Secondary | ICD-10-CM

## 2021-02-14 DIAGNOSIS — O09523 Supervision of elderly multigravida, third trimester: Secondary | ICD-10-CM

## 2021-02-14 DIAGNOSIS — O0991 Supervision of high risk pregnancy, unspecified, first trimester: Secondary | ICD-10-CM

## 2021-02-14 DIAGNOSIS — B009 Herpesviral infection, unspecified: Secondary | ICD-10-CM

## 2021-02-14 LAB — URINALYSIS
Bilirubin, UA: NEGATIVE
Glucose, UA: NEGATIVE
Nitrite, UA: POSITIVE — AB
Protein,UA: NEGATIVE
Specific Gravity, UA: 1.025 (ref 1.005–1.030)
Urobilinogen, Ur: 0.2 mg/dL (ref 0.2–1.0)
pH, UA: 6.5 (ref 5.0–7.5)

## 2021-02-14 NOTE — Progress Notes (Signed)
Patient here for MH RV at 30 1/7. Aware of [facility] appointment on 02/22/2021. Desires flu vaccine next visit.Marland KitchenMarland KitchenBurt Knack, RN
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
## 2021-02-22 ENCOUNTER — Encounter: Payer: Self-pay | Admitting: *Deleted

## 2021-02-22 ENCOUNTER — Other Ambulatory Visit: Payer: Self-pay

## 2021-02-22 ENCOUNTER — Encounter: Payer: Self-pay | Attending: Family Medicine | Admitting: *Deleted

## 2021-02-22 VITALS — BP 110/70 | Ht 62.0 in | Wt 250.8 lb

## 2021-02-22 DIAGNOSIS — Z3A00 Weeks of gestation of pregnancy not specified: Secondary | ICD-10-CM | POA: Insufficient documentation

## 2021-02-22 DIAGNOSIS — O24410 Gestational diabetes mellitus in pregnancy, diet controlled: Secondary | ICD-10-CM | POA: Insufficient documentation

## 2021-02-22 NOTE — Progress Notes (Signed)
Diabetes Self-Management Education  Visit Type: First/Initial  Appt. Start Time: 1455 Appt. End Time: 1610  02/22/2021  [name], identified by name and date of birth, is a 40 y.o. female with a diagnosis of Diabetes: Gestational Diabetes.   ASSESSMENT  Blood pressure 110/70, height 5\' 2"  (1.575 m), weight 250 lb 12.8 oz (113.8 kg), last menstrual period 07/18/2020, estimated date of delivery 04/24/2021 Body mass index is 45.87 kg/m.   Diabetes Self-Management Education - 02/22/21 1557       Visit Information   Visit Type First/Initial      Initial Visit   Diabetes Type Gestational Diabetes    Are you currently following a meal plan? No    Are you taking your medications as prescribed? Yes    Date Diagnosed 02/13/2021      Health Coping   How would you rate your overall health? Poor      Psychosocial Assessment   Patient Belief/Attitude about Diabetes Other (comment)   "stressed"   Self-care barriers None    Self-management support Doctor's office    Patient Concerns Nutrition/Meal planning;Weight Control    Special Needs None    Preferred Learning Style Auditory    Learning Readiness Ready    How often do you need to have someone help you when you read instructions, pamphlets, or other written materials from your doctor or pharmacy? 1 - Never    What is the last grade level you completed in school? high school      Pre-Education Assessment   Patient understands the diabetes disease and treatment process. Needs Instruction    Patient understands incorporating nutritional management into lifestyle. Needs Instruction    Patient undertands incorporating physical activity into lifestyle. Needs Instruction    Patient understands using medications safely. Needs Instruction    Patient understands monitoring blood glucose, interpreting and using results Needs Review    Patient understands prevention, detection, and treatment of acute complications. Needs  Instruction    Patient understands prevention, detection, and treatment of chronic complications. Needs Instruction    Patient understands how to develop strategies to address psychosocial issues. Needs Instruction    Patient understands how to develop strategies to promote health/change behavior. Needs Instruction      Complications   Last HgB A1C per patient/outside source 5.7 %   10/11/2020   How often do you check your blood sugar? 3-4 times/day    Fasting Blood glucose range (mg/dL) 10/13/2020   FBG's 121-139 mg/dL 7/7 elevated   Postprandial Blood glucose range (mg/dL) 9/7   pp breakfast 113-164 mg/dL 5/7 elevated; pp lunch 100-156 mg/dL 4/7 elevated; pp supper 127-139 mg/dL 6/6 elevated   Have you had a dilated eye exam in the past 12 months? No    Have you had a dental exam in the past 12 months? Yes    Are you checking your feet? Yes    How many days per week are you checking your feet? 2      Dietary Intake   Breakfast eggs, cuccumbers, lettuce, 6/7 bacon, tortilla, sausage and gravy biscuit    Snack (morning) she reports 2-3 snacks/day - usually fruit (orange, banana, mango, apple, pineapple, blueberries); yogurt    Lunch chicken with peppers, beef, 3 tortillas    Dinner fish, chicken, beef; beans, onions, peppers, tomatoes, cuccumbers, salads, lettuce, cauliflower, broccoli, zucchini    Beverage(s) water, milk, juice      Exercise   Exercise Type Light (walking / raking leaves)  How many days per week to you exercise? 3    How many minutes per day do you exercise? 30    Total minutes per week of exercise 90      Patient Education   Previous Diabetes Education No    Disease state  Definition of diabetes, type 1 and 2, and the diagnosis of diabetes;Factors that contribute to the development of diabetes    Nutrition management  Role of diet in the treatment of diabetes and the relationship between the three main macronutrients and blood glucose level;Food  label reading, portion sizes and measuring food.;Reviewed blood glucose goals for pre and post meals and how to evaluate the patients' food intake on their blood glucose level.    Physical activity and exercise  Role of exercise on diabetes management, blood pressure control and cardiac health.    Medications Other (comment)   Limited use of oral medications during pregnancy and potential for insulin.   Monitoring Purpose and frequency of SMBG.;Taught/discussed recording of test results and interpretation of SMBG.;Identified appropriate SMBG and/or A1C goals.;Ketone testing, when, how.    Chronic complications Relationship between chronic complications and blood glucose control    Psychosocial adjustment Identified and addressed patients feelings and concerns about diabetes    Preconception care Pregnancy and GDM  Role of pre-pregnancy blood glucose control on the development of the fetus;Reviewed with patient blood glucose goals with pregnancy;Role of family planning for patients with diabetes      Individualized Goals (developed by patient)   Reducing Risk Other (comment)   lose weight     Outcomes   Expected Outcomes Demonstrated interest in learning. Expect positive outcomes    Program Status Not Completed        Individualized Plan for Diabetes Self-Management Training:   Learning Objective:  Patient will have a greater understanding of diabetes self-management. Patient education plan is to attend individual and/or group sessions per assessed needs and concerns.   Plan:   Patient Instructions  Read booklet on Gestational Diabetes Follow Gestational Meal Planning Guidelines Avoid fruit juices Include 1 serving of protein when eating fruit for a snack Check blood sugars 4 x day - before breakfast and 2 hrs after every meal and record  Bring blood sugar log to all MD appointments Purchase urine ketone strips if instructed by MD and check urine ketones every am:  If + increase bedtime  snack to 1 protein and 2 carbohydrate servings Walk 20-30 minutes at least 5 x week if permitted by MD Call back if you want to schedule an appointment with the dietitian  Expected Outcomes:  Demonstrated interest in learning. Expect positive outcomes  Education material provided:  Gestational Booklet Gestational Meal Planning Guidelines Simple Meal Plan Viewed Gestational Diabetes Video Goals for a Healthy Pregnancy   If problems or questions, patient to contact team via:   Sharion Settler, RN, CCM, CDCES [phone]  Future DSME appointment:  The patient doesn't have insurance and does not want to return for a dietitian appointment at this time.

## 2021-02-22 NOTE — Patient Instructions (Signed)
Read booklet on Gestational Diabetes Follow Gestational Meal Planning Guidelines Avoid fruit juices Include 1 serving of protein when eating fruit for a snack Check blood sugars 4 x day - before breakfast and 2 hrs after every meal and record  Bring blood sugar log to all MD appointments Purchase urine ketone strips if instructed by MD and check urine ketones every am:  If + increase bedtime snack to 1 protein and 2 carbohydrate servings Walk 20-30 minutes at least 5 x week if permitted by MD Call back if you want to schedule an appointment with the dietitian

## 2021-02-23 ENCOUNTER — Ambulatory Visit: Payer: Self-pay | Admitting: Advanced Practice Midwife

## 2021-02-23 VITALS — BP 103/68 | HR 83 | Temp 97.2°F | Wt 249.8 lb

## 2021-02-23 DIAGNOSIS — O99213 Obesity complicating pregnancy, third trimester: Secondary | ICD-10-CM

## 2021-02-23 DIAGNOSIS — O0991 Supervision of high risk pregnancy, unspecified, first trimester: Secondary | ICD-10-CM

## 2021-02-23 DIAGNOSIS — O99210 Obesity complicating pregnancy, unspecified trimester: Secondary | ICD-10-CM

## 2021-02-23 DIAGNOSIS — O09522 Supervision of elderly multigravida, second trimester: Secondary | ICD-10-CM

## 2021-02-23 DIAGNOSIS — O24419 Gestational diabetes mellitus in pregnancy, unspecified control: Secondary | ICD-10-CM

## 2021-02-23 DIAGNOSIS — O0993 Supervision of high risk pregnancy, unspecified, third trimester: Secondary | ICD-10-CM

## 2021-02-23 DIAGNOSIS — O09523 Supervision of elderly multigravida, third trimester: Secondary | ICD-10-CM

## 2021-02-23 LAB — URINALYSIS
Bilirubin, UA: NEGATIVE
Glucose, UA: NEGATIVE
Nitrite, UA: NEGATIVE
Protein,UA: NEGATIVE
RBC, UA: NEGATIVE
Specific Gravity, UA: 1.02 (ref 1.005–1.030)
Urobilinogen, Ur: 0.2 mg/dL (ref 0.2–1.0)
pH, UA: 7 (ref 5.0–7.5)

## 2021-02-23 MED ORDER — PRENATAL VITAMIN 27-0.8 MG PO TABS: 1.0000 | ORAL_TABLET | Freq: Every day | ORAL | 0 refills | Status: DC

## 2021-02-23 NOTE — Progress Notes (Addendum)
Patient here for MH RV at 31 3/7. BS log copied, urine dip today. Patient has her glucometer and supplies, and had her diabetic teaching appointment yesterday. Aware to check her BS before breakfast, and 2 hours after each meal. She had been taking her 4th daily measurement before bed, instead of 2 hours after supper. She has corrected that and has been checking at appropriate times. Flu vaccine today. Kick counts reviewed and cards given.Marland KitchenMarland KitchenBurt Knack, RN
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
## 2021-02-23 NOTE — Progress Notes (Signed)
Flu vaccine given today, tolerated well, VIS given. Patient prefers written prescription for medication to take to [facility] to be filled.Burt Knack, RN

## 2021-03-02 ENCOUNTER — Ambulatory Visit: Payer: Self-pay | Admitting: Family Medicine

## 2021-03-02 ENCOUNTER — Other Ambulatory Visit: Payer: Self-pay

## 2021-03-02 ENCOUNTER — Telehealth: Payer: Self-pay

## 2021-03-02 ENCOUNTER — Telehealth: Payer: Self-pay | Admitting: *Deleted

## 2021-03-02 VITALS — BP 113/66 | HR 88 | Temp 98.0°F | Wt 248.0 lb

## 2021-03-02 DIAGNOSIS — O24419 Gestational diabetes mellitus in pregnancy, unspecified control: Secondary | ICD-10-CM

## 2021-03-02 DIAGNOSIS — O0993 Supervision of high risk pregnancy, unspecified, third trimester: Secondary | ICD-10-CM

## 2021-03-02 LAB — URINALYSIS
Bilirubin, UA: NEGATIVE
Glucose, UA: NEGATIVE
Ketones, UA: NEGATIVE
Nitrite, UA: NEGATIVE
RBC, UA: NEGATIVE
Specific Gravity, UA: 1.025 (ref 1.005–1.030)
Urobilinogen, Ur: 1 mg/dL (ref 0.2–1.0)
pH, UA: 7 (ref 5.0–7.5)

## 2021-03-02 NOTE — Telephone Encounter (Signed)
Received voicemail from patient late yesterday to call her back. I spoke with her today and she reports that the [facility] Dept wanted her to schedule another appointment with the dietitian. She reports that she has so many bills already due to multiple ultrasounds and she doesn't have insurance. I sent a letter to her OB on 10/27 that stated she was not able to afford another visit. The patient reports she was started on Metformin. She also reports that she has been eating fruit in the middle of the night and checking her blood sugar in the mornings. Explained that this was not a true fasting and was likely to elevate her fasting blood sugars. I instructed her to inform her OB. She has appointment later this morning. Also instructed her to tell OB that she doesn't have insurance and can't afford another visit with the dietitian at this time. She will call back if she can't resolve this issue with her OB today.
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
## 2021-03-02 NOTE — Progress Notes (Signed)
Patient here for MH RV at 32w 3d  Patient brought in blood sugar log and copy made - given to provider for review.   Urine dip reviewed during clinic visit.   Patient aware of upcoming [place] [facility] OB F/U 11/17 at 11am.   [facility] financial application given to patient and recommended patient to fill out and hand over application to financial counselor on her appointment 03/15/21.   [name], RN

## 2021-03-02 NOTE — Telephone Encounter (Signed)
TC to patient to schedule her NST appointments. Patient counseled on reasons for NST twice weekly for the rest of her pregnancy. Patient scheduled for 03/05/21 and 03/08/2021, 03/12/21 and 03/15/2021. Patient states understanding that she will need 1 MH RV with NST each week and another NST during the same week. Burt Knack, RN

## 2021-03-05 ENCOUNTER — Ambulatory Visit: Payer: Self-pay | Admitting: Family Medicine

## 2021-03-05 ENCOUNTER — Other Ambulatory Visit: Payer: Self-pay

## 2021-03-05 DIAGNOSIS — O24419 Gestational diabetes mellitus in pregnancy, unspecified control: Secondary | ICD-10-CM

## 2021-03-05 DIAGNOSIS — O0991 Supervision of high risk pregnancy, unspecified, first trimester: Secondary | ICD-10-CM

## 2021-03-05 NOTE — Progress Notes (Signed)
Per Dr. Alvester Morin, NST monitoring may be discontinued. Client aware of 03/08/2021 [facility] RV appt with NST. Jossie Ng, RN

## 2021-03-05 NOTE — Progress Notes (Signed)
Patient here for NST at 32 6/7. States she began taking her Metformin 1000mg  twice daily per provider orders on 03/02/2021. 13/4/2022Marland Kitchen, RN

## 2021-03-07 ENCOUNTER — Ambulatory Visit: Payer: Self-pay

## 2021-03-08 ENCOUNTER — Other Ambulatory Visit: Payer: Self-pay

## 2021-03-08 ENCOUNTER — Ambulatory Visit: Payer: Self-pay | Attending: Maternal & Fetal Medicine

## 2021-03-08 ENCOUNTER — Encounter: Payer: Self-pay | Admitting: Physician Assistant

## 2021-03-08 ENCOUNTER — Ambulatory Visit: Payer: Self-pay | Admitting: Physician Assistant

## 2021-03-08 VITALS — BP 98/56 | HR 85 | Temp 98.3°F | Wt 249.6 lb

## 2021-03-08 DIAGNOSIS — Z3A33 33 weeks gestation of pregnancy: Secondary | ICD-10-CM | POA: Insufficient documentation

## 2021-03-08 DIAGNOSIS — O99213 Obesity complicating pregnancy, third trimester: Secondary | ICD-10-CM | POA: Insufficient documentation

## 2021-03-08 DIAGNOSIS — O24419 Gestational diabetes mellitus in pregnancy, unspecified control: Secondary | ICD-10-CM

## 2021-03-08 DIAGNOSIS — O24415 Gestational diabetes mellitus in pregnancy, controlled by oral hypoglycemic drugs: Secondary | ICD-10-CM

## 2021-03-08 DIAGNOSIS — O0993 Supervision of high risk pregnancy, unspecified, third trimester: Secondary | ICD-10-CM

## 2021-03-08 DIAGNOSIS — O0991 Supervision of high risk pregnancy, unspecified, first trimester: Secondary | ICD-10-CM

## 2021-03-08 DIAGNOSIS — O09523 Supervision of elderly multigravida, third trimester: Secondary | ICD-10-CM

## 2021-03-08 DIAGNOSIS — B009 Herpesviral infection, unspecified: Secondary | ICD-10-CM

## 2021-03-08 DIAGNOSIS — Z8759 Personal history of other complications of pregnancy, childbirth and the puerperium: Secondary | ICD-10-CM | POA: Insufficient documentation

## 2021-03-08 DIAGNOSIS — E669 Obesity, unspecified: Secondary | ICD-10-CM

## 2021-03-08 DIAGNOSIS — O99210 Obesity complicating pregnancy, unspecified trimester: Secondary | ICD-10-CM

## 2021-03-08 LAB — URINALYSIS
Bilirubin, UA: POSITIVE — AB
Glucose, UA: NEGATIVE
Nitrite, UA: NEGATIVE
RBC, UA: NEGATIVE
Specific Gravity, UA: 1.02 (ref 1.005–1.030)
Urobilinogen, Ur: 1 mg/dL (ref 0.2–1.0)
pH, UA: 7 (ref 5.0–7.5)

## 2021-03-08 LAB — URINALYSIS, MICROSCOPIC ONLY
Casts: NONE SEEN /lpf
Crystals: NONE SEEN
Renal Epithel, UA: NONE SEEN /hpf
Trichomonas, UA: NONE SEEN
Yeast, UA: NONE SEEN
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
## 2021-03-08 NOTE — Progress Notes (Addendum)
Patient here for MH RV at [redacted]w[redacted]d and NST.   Patient aware that ultrasound is today at 4pm.   Urine dip reviewed during clinic visit --> Urine microscopic and urine culture and sensitivity added.   Urine microscopic reviewed during clinic.   Blood sugar log copy made and handed to provider.   Patient reports taking Metformin 1000mg  BID.   Unable to get NST on patient (due to gestational age and BMI). FHR tones in the 150s-160s.   , RN

## 2021-03-09 LAB — PROTEIN / CREATININE RATIO, URINE
Creatinine, Urine: 163.6 mg/dL
Protein, Ur: 35.2 mg/dL
Protein/Creat Ratio: 215 mg/g creat — ABNORMAL HIGH (ref 0–200)

## 2021-03-12 ENCOUNTER — Ambulatory Visit: Payer: Self-pay

## 2021-03-13 ENCOUNTER — Ambulatory Visit: Payer: Self-pay | Attending: Maternal & Fetal Medicine | Admitting: Maternal & Fetal Medicine

## 2021-03-13 ENCOUNTER — Other Ambulatory Visit: Payer: Self-pay

## 2021-03-13 DIAGNOSIS — O24419 Gestational diabetes mellitus in pregnancy, unspecified control: Secondary | ICD-10-CM

## 2021-03-13 DIAGNOSIS — Z3A34 34 weeks gestation of pregnancy: Secondary | ICD-10-CM

## 2021-03-13 DIAGNOSIS — O0993 Supervision of high risk pregnancy, unspecified, third trimester: Secondary | ICD-10-CM

## 2021-03-13 DIAGNOSIS — O09299 Supervision of pregnancy with other poor reproductive or obstetric history, unspecified trimester: Secondary | ICD-10-CM

## 2021-03-13 DIAGNOSIS — O09293 Supervision of pregnancy with other poor reproductive or obstetric history, third trimester: Secondary | ICD-10-CM

## 2021-03-13 DIAGNOSIS — O99213 Obesity complicating pregnancy, third trimester: Secondary | ICD-10-CM

## 2021-03-13 DIAGNOSIS — O09523 Supervision of elderly multigravida, third trimester: Secondary | ICD-10-CM

## 2021-03-13 NOTE — Procedures (Signed)
Beatriz Quintela January 06, 1981 [redacted]w[redacted]d  Fetus A Non-Stress Test Interpretation for 03/13/21  Indication: Gestational Diabetes medication controlled, AMA, Elevated BMI   Started @ 10:16 Completed @ 11:03  Fetal Heart Rate A Mode: External Baseline Rate (A): 145 bpm Variability: Moderate Accelerations: 15 x 15 Decelerations: None Multiple birth?: No  Uterine Activity Mode: Toco  Interpretation (Fetal Testing) Nonstress Test Interpretation: Reactive (Per Dr. Grace Bushy)

## 2021-03-15 ENCOUNTER — Other Ambulatory Visit: Payer: Self-pay

## 2021-03-15 ENCOUNTER — Ambulatory Visit: Payer: Self-pay

## 2021-03-15 ENCOUNTER — Ambulatory Visit (HOSPITAL_BASED_OUTPATIENT_CLINIC_OR_DEPARTMENT_OTHER): Payer: Self-pay | Admitting: Maternal & Fetal Medicine

## 2021-03-15 ENCOUNTER — Ambulatory Visit: Payer: Self-pay | Admitting: Nurse Practitioner

## 2021-03-15 ENCOUNTER — Ambulatory Visit: Payer: Self-pay | Attending: Maternal & Fetal Medicine

## 2021-03-15 VITALS — BP 111/74 | HR 81 | Temp 97.4°F | Wt 248.0 lb

## 2021-03-15 DIAGNOSIS — O24415 Gestational diabetes mellitus in pregnancy, controlled by oral hypoglycemic drugs: Secondary | ICD-10-CM

## 2021-03-15 DIAGNOSIS — Z3A34 34 weeks gestation of pregnancy: Secondary | ICD-10-CM | POA: Insufficient documentation

## 2021-03-15 DIAGNOSIS — O99210 Obesity complicating pregnancy, unspecified trimester: Secondary | ICD-10-CM

## 2021-03-15 DIAGNOSIS — O99213 Obesity complicating pregnancy, third trimester: Secondary | ICD-10-CM

## 2021-03-15 DIAGNOSIS — O09293 Supervision of pregnancy with other poor reproductive or obstetric history, third trimester: Secondary | ICD-10-CM | POA: Insufficient documentation

## 2021-03-15 DIAGNOSIS — B009 Herpesviral infection, unspecified: Secondary | ICD-10-CM

## 2021-03-15 DIAGNOSIS — O0993 Supervision of high risk pregnancy, unspecified, third trimester: Secondary | ICD-10-CM

## 2021-03-15 DIAGNOSIS — O24419 Gestational diabetes mellitus in pregnancy, unspecified control: Secondary | ICD-10-CM | POA: Insufficient documentation

## 2021-03-15 DIAGNOSIS — O09523 Supervision of elderly multigravida, third trimester: Secondary | ICD-10-CM

## 2021-03-15 DIAGNOSIS — E669 Obesity, unspecified: Secondary | ICD-10-CM | POA: Insufficient documentation

## 2021-03-15 DIAGNOSIS — O161 Unspecified maternal hypertension, first trimester: Secondary | ICD-10-CM

## 2021-03-15 LAB — URINALYSIS
Bilirubin, UA: NEGATIVE
Glucose, UA: NEGATIVE
Nitrite, UA: POSITIVE — AB
Protein,UA: NEGATIVE
RBC, UA: NEGATIVE
Specific Gravity, UA: 1.015 (ref 1.005–1.030)
Urobilinogen, Ur: 0.2 mg/dL (ref 0.2–1.0)
pH, UA: 7 (ref 5.0–7.5)

## 2021-03-15 MED ORDER — PRENATAL VITAMIN 27-0.8 MG PO TABS: 1.0000 | ORAL_TABLET | Freq: Every day | ORAL | 0 refills | Status: DC

## 2021-03-15 NOTE — Progress Notes (Signed)
Client has multiple NST / [place] appts at [facility] and has NST scheduled 03/23/2021 at [facility]. Client is aware of all appts. Jossie Ng, RN Urine dip reviewed by provider. Jossie Ng, RN
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
## 2021-03-15 NOTE — Procedures (Signed)
Emily [name] 07/26/1980 [redacted]w[redacted]d  Fetus A Non-Stress Test Interpretation for 03/15/21  Indication:  Hypertension , Elevated BMI Started @ 1519 Completed @ 1540  Fetal Heart Rate A Mode: External Baseline Rate (A): 125 bpm Variability: Moderate Accelerations: 15 x 15 Decelerations: None Multiple birth?: No  Uterine Activity Mode: Toco  Interpretation (Fetal Testing) Nonstress Test Interpretation: Reactive (Per Dr. Grace Bushy)

## 2021-03-19 ENCOUNTER — Encounter: Payer: Self-pay | Admitting: Family Medicine

## 2021-03-19 ENCOUNTER — Encounter: Payer: Self-pay | Admitting: Advanced Practice Midwife

## 2021-03-19 DIAGNOSIS — O8620 Urinary tract infection following delivery, unspecified: Secondary | ICD-10-CM | POA: Insufficient documentation

## 2021-03-19 LAB — URINE CULTURE

## 2021-03-19 NOTE — Progress Notes (Unsigned)
Patient ID: Emily [name], female   DOB: 07-27-1980, 40 y.o.   MRN: [id number] [facility] Department Maternity Care Conference  Maternity Care Conference Date: 03/19/21  [name] was identified by clinical staff to benefit from an interdisciplinary team approach to help improve pregnancy care.  The [facility] Maternity Care Conference includes the maternity clinic coordinator (RN), medical providers (MD/APP staff), Care Management -OBCM and Healthy Beginnings, Centering Pregnancy coordinator, Infant Mortality reduction Dietitian.  Nursing staff are also encouraged to participate. The group meets monthly to discuss patient care and coordinate services.   The patient's care care at the agency was reviewed in EMR and high risk factors evaluated in an interdisciplinary approach.    Value added interventions discussed at this care conference today were:   [name]  03/08/21 Patient currently is still getting NST's.  Not good progress during NST's. Continue to keep patient of list for progress.

## 2021-03-20 ENCOUNTER — Telehealth: Payer: Self-pay

## 2021-03-20 ENCOUNTER — Other Ambulatory Visit: Payer: Self-pay

## 2021-03-20 ENCOUNTER — Ambulatory Visit: Payer: Self-pay | Attending: Maternal & Fetal Medicine | Admitting: Maternal & Fetal Medicine

## 2021-03-20 ENCOUNTER — Ambulatory Visit: Payer: Self-pay

## 2021-03-20 ENCOUNTER — Ambulatory Visit (HOSPITAL_BASED_OUTPATIENT_CLINIC_OR_DEPARTMENT_OTHER): Payer: Self-pay

## 2021-03-20 VITALS — BP 122/77 | HR 109 | Temp 98.3°F | Ht 62.0 in | Wt 250.5 lb

## 2021-03-20 DIAGNOSIS — O09523 Supervision of elderly multigravida, third trimester: Secondary | ICD-10-CM

## 2021-03-20 DIAGNOSIS — O24415 Gestational diabetes mellitus in pregnancy, controlled by oral hypoglycemic drugs: Secondary | ICD-10-CM

## 2021-03-20 DIAGNOSIS — E669 Obesity, unspecified: Secondary | ICD-10-CM | POA: Insufficient documentation

## 2021-03-20 DIAGNOSIS — O99213 Obesity complicating pregnancy, third trimester: Secondary | ICD-10-CM | POA: Insufficient documentation

## 2021-03-20 DIAGNOSIS — Z3A35 35 weeks gestation of pregnancy: Secondary | ICD-10-CM | POA: Insufficient documentation

## 2021-03-20 NOTE — Telephone Encounter (Signed)
Client needs UTI treatment and per E. Sciora CNM, call client to come in for treatment. Call to client and left reminder message regarding previously scheduled MHC RV appt this pm and now needs UTI treatment. Jossie Ng, RN

## 2021-03-20 NOTE — Telephone Encounter (Signed)
Client's call transferred to clinic by Dian Queen. Per client, kept am appts at [facility] today, but recently received call that child needed to picked up from school due to HA, N & V. Per client, required to get Covid testing for child and unable to keep 1320 appt today as no one available to babysit child. Appt rescheduled for arrival time of 0800 in am. Client aware will be worked in. Jossie Ng, RN

## 2021-03-20 NOTE — Procedures (Signed)
Emily [name] Nov 04, 1980 [redacted]w[redacted]d  Fetus A Non-Stress Test Interpretation for 03/20/21  Started @ 11:12 Completed @ 1146  Indication: Gestational Diabetes medication controlled, AMA, Elevated BMI  Fetal Heart Rate A Mode: External Baseline Rate (A): 140 bpm Variability: Moderate Accelerations: 15 x 15 Decelerations: None Multiple birth?: No     Interpretation (Fetal Testing) Nonstress Test Interpretation: Reactive (Per Dr. Grace Bushy)

## 2021-03-21 ENCOUNTER — Ambulatory Visit: Payer: Self-pay | Admitting: Family Medicine

## 2021-03-21 VITALS — BP 125/74 | HR 80 | Temp 97.5°F | Wt 251.0 lb

## 2021-03-21 DIAGNOSIS — O0993 Supervision of high risk pregnancy, unspecified, third trimester: Secondary | ICD-10-CM

## 2021-03-21 DIAGNOSIS — O24415 Gestational diabetes mellitus in pregnancy, controlled by oral hypoglycemic drugs: Secondary | ICD-10-CM

## 2021-03-21 DIAGNOSIS — R8279 Other abnormal findings on microbiological examination of urine: Secondary | ICD-10-CM

## 2021-03-21 DIAGNOSIS — B009 Herpesviral infection, unspecified: Secondary | ICD-10-CM

## 2021-03-21 LAB — URINALYSIS
Bilirubin, UA: POSITIVE — AB
Glucose, UA: NEGATIVE
Nitrite, UA: NEGATIVE
RBC, UA: NEGATIVE
Specific Gravity, UA: 1.02 (ref 1.005–1.030)
Urobilinogen, Ur: 1 mg/dL (ref 0.2–1.0)
pH, UA: 7 (ref 5.0–7.5)

## 2021-03-21 MED ORDER — NITROFURANTOIN MONOHYD MACRO 100 MG PO CAPS: 100.0000 mg | ORAL_CAPSULE | Freq: Two times a day (BID) | ORAL | 0 refills | Status: DC

## 2021-03-21 MED ORDER — ACYCLOVIR 800 MG PO TABS: 800.0000 mg | ORAL_TABLET | Freq: Every day | ORAL | 2 refills | Status: DC
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
## 2021-03-21 NOTE — Progress Notes (Signed)
Patient here for MH RV at 35w 1d.   Blood sugar log brought in by patient - copy made and handed to provider.   Urine dip reviewed during clinic visit.   Dispensed to patient Macrobid 100mg  to take BID/7 days for UTI. Education given and questions answered.   Patient also informed to pick up Acyclovir at [facility] pharmacy and to start taking on Tuesday 11/29 when she is [redacted] weeks gestation.   Also informed patient that starting on Tuesday 11/29 she will no longer need to take daily ASA 81 mg.   She is also aware of following NST and [birth year]-1977 appointments, including NST scheduled at [facility] for 03/23/21. Patient was informed to register at [facility] ED for NST appointment and that then she will be escorted to L&D.   Patient verbalized understating.   [facility], RN

## 2021-03-23 ENCOUNTER — Observation Stay
Admission: RE | Admit: 2021-03-23 | Discharge: 2021-03-23 | Disposition: A | Discharge status: Home / Self Care | Payer: Self-pay | Source: Ambulatory Visit | Attending: Obstetrics and Gynecology | Admitting: Obstetrics and Gynecology

## 2021-03-23 ENCOUNTER — Ambulatory Visit: Payer: Self-pay

## 2021-03-23 DIAGNOSIS — Z3A35 35 weeks gestation of pregnancy: Secondary | ICD-10-CM | POA: Insufficient documentation

## 2021-03-23 DIAGNOSIS — O24415 Gestational diabetes mellitus in pregnancy, controlled by oral hypoglycemic drugs: Secondary | ICD-10-CM | POA: Insufficient documentation

## 2021-03-23 DIAGNOSIS — Z6841 Body Mass Index (BMI) 40.0 and over, adult: Secondary | ICD-10-CM | POA: Insufficient documentation

## 2021-03-23 DIAGNOSIS — O99213 Obesity complicating pregnancy, third trimester: Secondary | ICD-10-CM | POA: Insufficient documentation

## 2021-03-23 DIAGNOSIS — E669 Obesity, unspecified: Secondary | ICD-10-CM | POA: Insufficient documentation

## 2021-03-23 DIAGNOSIS — O09893 Supervision of other high risk pregnancies, third trimester: Principal | ICD-10-CM | POA: Insufficient documentation

## 2021-03-23 DIAGNOSIS — O24419 Gestational diabetes mellitus in pregnancy, unspecified control: Secondary | ICD-10-CM | POA: Diagnosis present

## 2021-03-23 NOTE — Discharge Summary (Signed)
Emily [name] is a 40 y.o. female. She is at [redacted]w[redacted]d gestation. No LMP recorded (lmp unknown). Patient is pregnant. Estimated Date of Delivery: 04/24/21    Prenatal care site: [facility] with MFM   Chief Complaint: high risk pregnancy, need for antepartum surveillance  Preg c/b: AMA GDM- metformin Obesity, BMI 45.7    Maternal Medical History:   Past Medical History:  Diagnosis Date   Anemia    states anemia pregnancy #4   Gestational diabetes mellitus, antepartum 02/13/2021   Herpes 01/28/2007   perpes simple complex cold sore lesion   Irregular menses 04/18/2006   Obesity 06/08/2014   PCOS (polycystic ovarian syndrome) 04/18/2006   Prediabetes 10/27/2018   Vitamin D deficiency 05/08/2020    Past Surgical History:  Procedure Laterality Date   NO PAST SURGERIES      No Known Allergies  Prior to Admission medications   Medication Sig Start Date End Date Taking? Authorizing Provider  acyclovir (ZOVIRAX) 800 MG tablet Take 1 tablet (800 mg total) by mouth daily. 03/21/21  Yes Wendi Snipes, FNP  aspirin 81 MG chewable tablet Chew 81 mg by mouth daily.   Yes [provider]  metFORMIN (GLUCOPHAGE) 500 MG tablet Take 1,000 mg by mouth 2 (two) times daily. 02/23/21  Yes [provider]  nitrofurantoin, macrocrystal-monohydrate, (MACROBID) 100 MG capsule Take 1 capsule (100 mg total) by mouth 2 (two) times daily. 03/21/21  Yes Wendi Snipes, FNP  Prenatal Vit-Fe Fumarate-FA (PRENATAL VITAMIN) 27-0.8 MG TABS Take 1 tablet by mouth daily at 6 (six) AM. 03/15/21  Yes Federico Flake, MD     Social History: She  reports that she has never smoked. She has never been exposed to tobacco smoke. She has never used smokeless tobacco. She reports that she does not currently use alcohol after a past usage of about 1.0 standard drink per week. She reports that she does not use drugs.  Family History: family history includes Other in her father, maternal  grandfather, maternal grandmother, mother, paternal grandfather, and paternal grandmother.      O:  BP 125/74   Pulse 81   Temp 97.8 F (36.6 C) (Oral)   Resp 18   Ht 5\' 2"  (1.575 m)   Wt 113.4 kg   LMP  (LMP Unknown)   BMI 45.73 kg/m  Results for orders placed or performed in visit on 03/21/21 (from the past 48 hour(s))  Urinalysis (Urine Dip)   Collection Time: 03/21/21 12:15 PM  Result Value Ref Range   Specific Gravity, UA 1.020 1.005 - 1.030   pH, UA 7.0 5.0 - 7.5   Color, UA Yellow Yellow   Appearance Ur Clear Clear   Leukocytes,UA Trace (A) Negative   Protein,UA 1+ (A) Negative/Trace   Glucose, UA Negative Negative   Ketones, UA 2+ (A) Negative   RBC, UA Negative Negative   Bilirubin, UA Positive (A) Negative   Urobilinogen, Ur 1.0 0.2 - 1.0 mg/dL   Nitrite, UA Negative Negative        Baseline: 135 Variability: moderate Accelerations present x >2 Decelerations absent Time 03/23/21    A/P: 40 y.o. [redacted]w[redacted]d with high risk pregnancy and antepartum surveillance for GDM  Fetal Wellbeing: Reassuring Cat 1 tracing. Reactive NST  D/c home stable, precautions reviewed, follow-up as scheduled.   ----- [redacted]w[redacted]d, CNM 03/23/2021  10:51 AM

## 2021-03-23 NOTE — OB Triage Note (Signed)
Pt is A g5P4 at [redacted]w[redacted]d that presents from ED for scheduled NST. Pt denies VB, LOF and states positive FM. EFM applied and initial FHT 145.

## 2021-03-23 NOTE — OB Triage Note (Signed)
Patient Discharged home per provider. Pt educated about labor precautions and informed when to return to the ED for further evaluation. Pt instructed to keep all follow up appointments with her provider. AVS given to patient and RN answered all questions and patient has no further questions at this time. Pt discharged home in stable condition. °

## 2021-03-26 ENCOUNTER — Other Ambulatory Visit: Payer: Self-pay

## 2021-03-26 DIAGNOSIS — O24415 Gestational diabetes mellitus in pregnancy, controlled by oral hypoglycemic drugs: Secondary | ICD-10-CM

## 2021-03-26 DIAGNOSIS — O99213 Obesity complicating pregnancy, third trimester: Secondary | ICD-10-CM

## 2021-03-26 DIAGNOSIS — O09523 Supervision of elderly multigravida, third trimester: Secondary | ICD-10-CM

## 2021-03-26 DIAGNOSIS — R8279 Other abnormal findings on microbiological examination of urine: Secondary | ICD-10-CM | POA: Insufficient documentation

## 2021-03-27 ENCOUNTER — Ambulatory Visit: Payer: Self-pay | Attending: Maternal & Fetal Medicine

## 2021-03-27 ENCOUNTER — Other Ambulatory Visit: Payer: Self-pay

## 2021-03-27 DIAGNOSIS — Z3A36 36 weeks gestation of pregnancy: Secondary | ICD-10-CM | POA: Insufficient documentation

## 2021-03-27 DIAGNOSIS — E669 Obesity, unspecified: Secondary | ICD-10-CM

## 2021-03-27 DIAGNOSIS — O99213 Obesity complicating pregnancy, third trimester: Secondary | ICD-10-CM | POA: Insufficient documentation

## 2021-03-27 DIAGNOSIS — O24415 Gestational diabetes mellitus in pregnancy, controlled by oral hypoglycemic drugs: Secondary | ICD-10-CM | POA: Insufficient documentation

## 2021-03-27 DIAGNOSIS — O09523 Supervision of elderly multigravida, third trimester: Secondary | ICD-10-CM | POA: Insufficient documentation

## 2021-03-28 ENCOUNTER — Ambulatory Visit: Payer: Self-pay | Admitting: Advanced Practice Midwife

## 2021-03-28 ENCOUNTER — Telehealth: Payer: Self-pay | Admitting: Advanced Practice Midwife

## 2021-03-28 VITALS — BP 126/77 | HR 83 | Temp 97.5°F | Wt 250.2 lb

## 2021-03-28 DIAGNOSIS — O24415 Gestational diabetes mellitus in pregnancy, controlled by oral hypoglycemic drugs: Secondary | ICD-10-CM

## 2021-03-28 DIAGNOSIS — O99213 Obesity complicating pregnancy, third trimester: Secondary | ICD-10-CM

## 2021-03-28 DIAGNOSIS — B009 Herpesviral infection, unspecified: Secondary | ICD-10-CM

## 2021-03-28 DIAGNOSIS — O99210 Obesity complicating pregnancy, unspecified trimester: Secondary | ICD-10-CM

## 2021-03-28 DIAGNOSIS — O0993 Supervision of high risk pregnancy, unspecified, third trimester: Secondary | ICD-10-CM

## 2021-03-28 DIAGNOSIS — O09523 Supervision of elderly multigravida, third trimester: Secondary | ICD-10-CM

## 2021-03-28 DIAGNOSIS — O8620 Urinary tract infection following delivery, unspecified: Secondary | ICD-10-CM

## 2021-03-28 LAB — URINALYSIS
Bilirubin, UA: NEGATIVE
Glucose, UA: NEGATIVE
Ketones, UA: NEGATIVE
Nitrite, UA: NEGATIVE
Specific Gravity, UA: 1.02 (ref 1.005–1.030)
Urobilinogen, Ur: 1 mg/dL (ref 0.2–1.0)
pH, UA: 7 (ref 5.0–7.5)

## 2021-03-28 MED ORDER — NITROFURANTOIN MONOHYD MACRO 100 MG PO CAPS: 100.0000 mg | ORAL_CAPSULE | Freq: Two times a day (BID) | ORAL | 0 refills | Status: DC
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
## 2021-03-28 NOTE — Telephone Encounter (Signed)
T/C to pt to inform her of consult with Dr. Logan Bores. Only suggestion he had was for her to take 1000 mg Metformin right before she goes to bed and right before lunch. Pt agreeable to plan

## 2021-03-28 NOTE — Progress Notes (Signed)
Patient here for MH RV at 36 1/7. 36 week labs today, patient self-collecting. 36 week packet given and reviewed. BS log copied and urine dip reviewed with provider. Patient states she took 2 days worth of her Macrobid and lost track of the medicine bottle. Stopped her ASA and has started her Acyclovir.Burt Knack, RN

## 2021-03-29 ENCOUNTER — Other Ambulatory Visit: Payer: Self-pay

## 2021-03-29 DIAGNOSIS — O09523 Supervision of elderly multigravida, third trimester: Secondary | ICD-10-CM

## 2021-03-29 DIAGNOSIS — O24415 Gestational diabetes mellitus in pregnancy, controlled by oral hypoglycemic drugs: Secondary | ICD-10-CM

## 2021-03-29 DIAGNOSIS — O99213 Obesity complicating pregnancy, third trimester: Secondary | ICD-10-CM

## 2021-03-30 LAB — CHLAMYDIA/GC NAA, CONFIRMATION
Chlamydia trachomatis, NAA: NEGATIVE
Neisseria gonorrhoeae, NAA: NEGATIVE

## 2021-03-30 NOTE — Progress Notes (Signed)
NST was difficult due to maternal habitus.

## 2021-04-01 LAB — CULTURE, BETA STREP (GROUP B ONLY): Strep Gp B Culture: NEGATIVE

## 2021-04-02 LAB — URINE CULTURE

## 2021-04-03 ENCOUNTER — Other Ambulatory Visit: Payer: Self-pay

## 2021-04-03 ENCOUNTER — Ambulatory Visit: Payer: Self-pay | Attending: Obstetrics

## 2021-04-03 DIAGNOSIS — Z3A37 37 weeks gestation of pregnancy: Secondary | ICD-10-CM | POA: Diagnosis not present

## 2021-04-03 DIAGNOSIS — E669 Obesity, unspecified: Secondary | ICD-10-CM | POA: Insufficient documentation

## 2021-04-03 DIAGNOSIS — O24415 Gestational diabetes mellitus in pregnancy, controlled by oral hypoglycemic drugs: Secondary | ICD-10-CM | POA: Insufficient documentation

## 2021-04-03 DIAGNOSIS — O09523 Supervision of elderly multigravida, third trimester: Secondary | ICD-10-CM | POA: Insufficient documentation

## 2021-04-03 DIAGNOSIS — O99213 Obesity complicating pregnancy, third trimester: Secondary | ICD-10-CM | POA: Insufficient documentation

## 2021-04-05 ENCOUNTER — Other Ambulatory Visit: Payer: Self-pay

## 2021-04-05 ENCOUNTER — Ambulatory Visit: Payer: Self-pay | Admitting: Advanced Practice Midwife

## 2021-04-05 VITALS — BP 119/73 | HR 88 | Wt 251.6 lb

## 2021-04-05 DIAGNOSIS — O24415 Gestational diabetes mellitus in pregnancy, controlled by oral hypoglycemic drugs: Secondary | ICD-10-CM

## 2021-04-05 DIAGNOSIS — O0993 Supervision of high risk pregnancy, unspecified, third trimester: Secondary | ICD-10-CM

## 2021-04-05 DIAGNOSIS — O99210 Obesity complicating pregnancy, unspecified trimester: Secondary | ICD-10-CM

## 2021-04-05 DIAGNOSIS — B009 Herpesviral infection, unspecified: Secondary | ICD-10-CM

## 2021-04-05 DIAGNOSIS — R8279 Other abnormal findings on microbiological examination of urine: Secondary | ICD-10-CM

## 2021-04-05 DIAGNOSIS — O99213 Obesity complicating pregnancy, third trimester: Secondary | ICD-10-CM

## 2021-04-05 DIAGNOSIS — O09523 Supervision of elderly multigravida, third trimester: Secondary | ICD-10-CM

## 2021-04-05 LAB — URINALYSIS
Bilirubin, UA: POSITIVE — AB
Glucose, UA: NEGATIVE
Ketones, UA: NEGATIVE
Nitrite, UA: NEGATIVE
RBC, UA: NEGATIVE
Specific Gravity, UA: 1.02 (ref 1.005–1.030)
Urobilinogen, Ur: 1 mg/dL (ref 0.2–1.0)
pH, UA: 7 (ref 5.0–7.5)

## 2021-04-05 NOTE — Progress Notes (Signed)
Patient here at [redacted]w[redacted]d for MH RV.   Patient reports she completed Macrobid course on 04/03/21. C&S culture ordered today. Reports to taking PNV and Acyclovir daily.   Urine dip reviewed with provider during clinic visit.   Blood sugar log brought in by patient - copy made and handed over to provider for review.   Patient aware of follow-up US MFM OB on 04/10/21 at 2pm.   [name], RN
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
## 2021-04-05 NOTE — Progress Notes (Signed)
IOl form faxed to [employer] with okay confirmation.   [name], RN

## 2021-04-09 ENCOUNTER — Encounter: Payer: Self-pay | Admitting: Family Medicine

## 2021-04-09 LAB — URINE CULTURE

## 2021-04-09 NOTE — Progress Notes (Unsigned)
Patient ID: Emily [name], female   DOB: Oct 06, 1980, 40 y.o.   MRN: [id number] [facility] Department Maternity Care Conference  Maternity Care Conference Date: 04/09/21  [name] was identified by clinical staff to benefit from an interdisciplinary team approach to help improve pregnancy care.  The [facility] Maternity Care Conference includes the maternity clinic coordinator (RN), medical providers (MD/APP staff), Care Management -OBCM and Healthy Beginnings, Centering Pregnancy coordinator, Infant Mortality reduction Dietitian.  Nursing staff are also encouraged to participate. The group meets monthly to discuss patient care and coordinate services.   The patient's care care at the agency was reviewed in EMR and high risk factors evaluated in an interdisciplinary approach.    Value added interventions discussed at this care conference today were:    [facility] Meeting 04/05/21 Patient has controlled sugar levels due to provider suggesting to take meds at different times of the day Baby is continued to be monitored  Signed induction for 39 weeks  NST 2x a week  Fetal weight on 11/17 was at 88 percentile  L.Synetta Fail

## 2021-04-10 ENCOUNTER — Other Ambulatory Visit: Payer: Self-pay

## 2021-04-10 ENCOUNTER — Other Ambulatory Visit: Payer: Self-pay | Admitting: Obstetrics and Gynecology

## 2021-04-10 ENCOUNTER — Other Ambulatory Visit: Payer: Self-pay | Admitting: Maternal & Fetal Medicine

## 2021-04-10 ENCOUNTER — Ambulatory Visit: Payer: Self-pay | Attending: Maternal & Fetal Medicine

## 2021-04-10 DIAGNOSIS — O09523 Supervision of elderly multigravida, third trimester: Secondary | ICD-10-CM | POA: Insufficient documentation

## 2021-04-10 DIAGNOSIS — Z7984 Long term (current) use of oral hypoglycemic drugs: Secondary | ICD-10-CM | POA: Diagnosis not present

## 2021-04-10 DIAGNOSIS — E669 Obesity, unspecified: Secondary | ICD-10-CM | POA: Insufficient documentation

## 2021-04-10 DIAGNOSIS — O24415 Gestational diabetes mellitus in pregnancy, controlled by oral hypoglycemic drugs: Secondary | ICD-10-CM | POA: Insufficient documentation

## 2021-04-10 DIAGNOSIS — O09293 Supervision of pregnancy with other poor reproductive or obstetric history, third trimester: Secondary | ICD-10-CM | POA: Diagnosis not present

## 2021-04-10 DIAGNOSIS — O99213 Obesity complicating pregnancy, third trimester: Secondary | ICD-10-CM | POA: Diagnosis not present

## 2021-04-10 DIAGNOSIS — Z3A38 38 weeks gestation of pregnancy: Secondary | ICD-10-CM | POA: Diagnosis not present
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
## 2021-04-11 ENCOUNTER — Telehealth: Payer: Self-pay

## 2021-04-11 NOTE — Telephone Encounter (Signed)
Per [facility] clerk, need to call L & D for IOL appt. Call to [facility] L [facility] D and IOL appt scheduled for 04/18/2021 at 0800. Call to client and notified of IOL appt. Client reminded of [facility] RV appt tomorrow. Jossie Ng, RN

## 2021-04-12 ENCOUNTER — Ambulatory Visit: Payer: Self-pay | Admitting: Advanced Practice Midwife

## 2021-04-12 ENCOUNTER — Other Ambulatory Visit: Payer: Self-pay

## 2021-04-12 ENCOUNTER — Ambulatory Visit: Payer: Self-pay

## 2021-04-12 VITALS — BP 123/76 | HR 76 | Temp 97.9°F | Wt 248.8 lb

## 2021-04-12 DIAGNOSIS — O0993 Supervision of high risk pregnancy, unspecified, third trimester: Secondary | ICD-10-CM

## 2021-04-12 DIAGNOSIS — O24415 Gestational diabetes mellitus in pregnancy, controlled by oral hypoglycemic drugs: Secondary | ICD-10-CM

## 2021-04-12 DIAGNOSIS — O99210 Obesity complicating pregnancy, unspecified trimester: Secondary | ICD-10-CM

## 2021-04-12 DIAGNOSIS — O09523 Supervision of elderly multigravida, third trimester: Secondary | ICD-10-CM

## 2021-04-12 DIAGNOSIS — O99213 Obesity complicating pregnancy, third trimester: Secondary | ICD-10-CM

## 2021-04-12 LAB — URINALYSIS
Bilirubin, UA: NEGATIVE
Glucose, UA: NEGATIVE
Ketones, UA: NEGATIVE
Nitrite, UA: NEGATIVE
Specific Gravity, UA: 1.02 (ref 1.005–1.030)
Urobilinogen, Ur: 1 mg/dL (ref 0.2–1.0)
pH, UA: 7 (ref 5.0–7.5)
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
## 2021-04-12 NOTE — Progress Notes (Signed)
Patient here for MH RV at 38 2/7. Aware of [facility] IOL on 04/18/2021 at 0800. Urine dip today, and BS log copied.Burt Knack, RN

## 2021-04-17 ENCOUNTER — Inpatient Hospital Stay
Admission: EM | Admit: 2021-04-17 | Discharge: 2021-04-19 | DRG: 806 | Disposition: A | Discharge status: Home / Self Care | Payer: Medicaid Other | Attending: Certified Nurse Midwife | Admitting: Certified Nurse Midwife

## 2021-04-17 ENCOUNTER — Encounter: Payer: Self-pay | Admitting: Obstetrics and Gynecology

## 2021-04-17 ENCOUNTER — Other Ambulatory Visit: Payer: Self-pay

## 2021-04-17 ENCOUNTER — Telehealth: Payer: Self-pay

## 2021-04-17 ENCOUNTER — Ambulatory Visit: Payer: Medicaid Other

## 2021-04-17 DIAGNOSIS — O9081 Anemia of the puerperium: Secondary | ICD-10-CM | POA: Diagnosis not present

## 2021-04-17 DIAGNOSIS — O1495 Unspecified pre-eclampsia, complicating the puerperium: Secondary | ICD-10-CM | POA: Diagnosis not present

## 2021-04-17 DIAGNOSIS — O99214 Obesity complicating childbirth: Secondary | ICD-10-CM | POA: Diagnosis present

## 2021-04-17 DIAGNOSIS — D62 Acute posthemorrhagic anemia: Secondary | ICD-10-CM | POA: Diagnosis not present

## 2021-04-17 DIAGNOSIS — O9832 Other infections with a predominantly sexual mode of transmission complicating childbirth: Secondary | ICD-10-CM | POA: Diagnosis present

## 2021-04-17 DIAGNOSIS — Z20822 Contact with and (suspected) exposure to covid-19: Secondary | ICD-10-CM | POA: Diagnosis present

## 2021-04-17 DIAGNOSIS — O24420 Gestational diabetes mellitus in childbirth, diet controlled: Secondary | ICD-10-CM | POA: Diagnosis present

## 2021-04-17 DIAGNOSIS — A6000 Herpesviral infection of urogenital system, unspecified: Secondary | ICD-10-CM | POA: Diagnosis present

## 2021-04-17 DIAGNOSIS — O24415 Gestational diabetes mellitus in pregnancy, controlled by oral hypoglycemic drugs: Secondary | ICD-10-CM

## 2021-04-17 DIAGNOSIS — O26893 Other specified pregnancy related conditions, third trimester: Secondary | ICD-10-CM | POA: Diagnosis present

## 2021-04-17 DIAGNOSIS — Z3A39 39 weeks gestation of pregnancy: Secondary | ICD-10-CM | POA: Diagnosis not present

## 2021-04-17 DIAGNOSIS — Z3490 Encounter for supervision of normal pregnancy, unspecified, unspecified trimester: Secondary | ICD-10-CM | POA: Diagnosis present

## 2021-04-17 LAB — CBC
HCT: 39.2 % (ref 36.0–46.0)
HCT: 37.4 % (ref 36.0–46.0)
Hemoglobin: 13.5 g/dL (ref 12.0–15.0)
Hemoglobin: 13 g/dL (ref 12.0–15.0)
MCH: 28.1 pg (ref 26.0–34.0)
MCH: 28.4 pg (ref 26.0–34.0)
MCHC: 34.4 g/dL (ref 30.0–36.0)
MCHC: 34.8 g/dL (ref 30.0–36.0)
MCV: 81.5 fL (ref 80.0–100.0)
MCV: 81.7 fL (ref 80.0–100.0)
Platelets: 306 10*3/uL (ref 150–400)
Platelets: 301 10*3/uL (ref 150–400)
RBC: 4.81 MIL/uL (ref 3.87–5.11)
RBC: 4.58 MIL/uL (ref 3.87–5.11)
RDW: 13.7 % (ref 11.5–15.5)
RDW: 13.6 % (ref 11.5–15.5)
WBC: 10.6 10*3/uL — ABNORMAL HIGH (ref 4.0–10.5)
WBC: 15.9 10*3/uL — ABNORMAL HIGH (ref 4.0–10.5)
nRBC: 0 % (ref 0.0–0.2)
nRBC: 0 % (ref 0.0–0.2)

## 2021-04-17 LAB — COMPREHENSIVE METABOLIC PANEL
ALT: 17 U/L (ref 0–44)
AST: 20 U/L (ref 15–41)
Albumin: 2.6 g/dL — ABNORMAL LOW (ref 3.5–5.0)
Alkaline Phosphatase: 168 U/L — ABNORMAL HIGH (ref 38–126)
Anion gap: 6 (ref 5–15)
BUN: 11 mg/dL (ref 6–20)
CO2: 22 mmol/L (ref 22–32)
Calcium: 8.7 mg/dL — ABNORMAL LOW (ref 8.9–10.3)
Chloride: 106 mmol/L (ref 98–111)
Creatinine, Ser: 0.56 mg/dL (ref 0.44–1.00)
GFR, Estimated: >60 mL/min (ref >60)
Glucose, Bld: 113 mg/dL — ABNORMAL HIGH (ref 70–99)
Potassium: 4.1 mmol/L (ref 3.5–5.1)
Sodium: 134 mmol/L — ABNORMAL LOW (ref 135–145)
Total Bilirubin: 1 mg/dL (ref 0.3–1.2)
Total Protein: 6.2 g/dL — ABNORMAL LOW (ref 6.5–8.1)

## 2021-04-17 LAB — GLUCOSE, CAPILLARY: Glucose-Capillary: 102 mg/dL — ABNORMAL HIGH (ref 70–99)

## 2021-04-17 LAB — RESP PANEL BY RT-PCR (FLU A&B, COVID) ARPGX2
Influenza A by PCR: NEGATIVE
Influenza B by PCR: NEGATIVE
SARS Coronavirus 2 by RT PCR: NEGATIVE

## 2021-04-17 LAB — ABO/RH: ABO/RH(D): O POS

## 2021-04-17 LAB — PROTEIN / CREATININE RATIO, URINE
Creatinine, Urine: 126 mg/dL
Protein Creatinine Ratio: 0.43 mg/mg{Cre} — ABNORMAL HIGH (ref 0.00–0.15)
Total Protein, Urine: 54 mg/dL

## 2021-04-17 MED ORDER — LIDOCAINE HCL (PF) 1 % IJ SOLN: 30.0000 mL | INTRAMUSCULAR | Status: DC | PRN

## 2021-04-17 MED ORDER — ONDANSETRON HCL 4 MG/2ML IJ SOLN: 4.0000 mg | INTRAMUSCULAR | Status: DC | PRN

## 2021-04-17 MED ORDER — LIDOCAINE HCL (PF) 1 % IJ SOLN
INTRAMUSCULAR | Status: AC
  Filled 2021-04-17: qty 30

## 2021-04-17 MED ORDER — ONDANSETRON HCL 4 MG/2ML IJ SOLN: 4.0000 mg | Freq: Four times a day (QID) | INTRAMUSCULAR | Status: DC | PRN

## 2021-04-17 MED ORDER — DIBUCAINE (PERIANAL) 1 % EX OINT: 1.0000 "application " | TOPICAL_OINTMENT | CUTANEOUS | Status: DC | PRN

## 2021-04-17 MED ORDER — NIFEDIPINE ER OSMOTIC RELEASE 30 MG PO TB24
30.0000 mg | ORAL_TABLET | Freq: Every day | ORAL | Status: DC
  Administered 2021-04-17: 19:00:00 30 mg via ORAL
  Filled 2021-04-17: qty 1

## 2021-04-17 MED ORDER — MISOPROSTOL 25 MCG QUARTER TABLET: 25.0000 ug | ORAL_TABLET | ORAL | Status: DC | PRN

## 2021-04-17 MED ORDER — COCONUT OIL OIL
1.0000 "application " | TOPICAL_OIL | Status: DC | PRN
  Filled 2021-04-17: qty 120

## 2021-04-17 MED ORDER — AMMONIA AROMATIC IN INHA
RESPIRATORY_TRACT | Status: AC
  Filled 2021-04-17: qty 10

## 2021-04-17 MED ORDER — SODIUM CHLORIDE 0.9% FLUSH: 3.0000 mL | INTRAVENOUS | Status: DC | PRN

## 2021-04-17 MED ORDER — LACTATED RINGERS IV SOLN: 500.0000 mL | INTRAVENOUS | Status: DC | PRN

## 2021-04-17 MED ORDER — ZOLPIDEM TARTRATE 5 MG PO TABS: 5.0000 mg | ORAL_TABLET | Freq: Every evening | ORAL | Status: DC | PRN

## 2021-04-17 MED ORDER — LABETALOL HCL 5 MG/ML IV SOLN: 20.0000 mg | INTRAVENOUS | Status: DC | PRN

## 2021-04-17 MED ORDER — PRENATAL MULTIVITAMIN CH
1.0000 | ORAL_TABLET | Freq: Every day | ORAL | Status: DC
  Administered 2021-04-18: 12:00:00 1 via ORAL
  Filled 2021-04-17: qty 1

## 2021-04-17 MED ORDER — ONDANSETRON HCL 4 MG PO TABS: 4.0000 mg | ORAL_TABLET | ORAL | Status: DC | PRN

## 2021-04-17 MED ORDER — SODIUM CHLORIDE 0.9 % IV SOLN: 250.0000 mL | INTRAVENOUS | Status: DC | PRN

## 2021-04-17 MED ORDER — MISOPROSTOL 200 MCG PO TABS
ORAL_TABLET | ORAL | Status: AC
  Filled 2021-04-17: qty 4

## 2021-04-17 MED ORDER — BISACODYL 10 MG RE SUPP
10.0000 mg | Freq: Every day | RECTAL | Status: DC | PRN
  Filled 2021-04-17: qty 1

## 2021-04-17 MED ORDER — ACETAMINOPHEN 325 MG PO TABS: 650.0000 mg | ORAL_TABLET | ORAL | Status: DC | PRN

## 2021-04-17 MED ORDER — WITCH HAZEL-GLYCERIN EX PADS: 1.0000 "application " | MEDICATED_PAD | CUTANEOUS | Status: DC | PRN

## 2021-04-17 MED ORDER — METHYLERGONOVINE MALEATE 0.2 MG/ML IJ SOLN
INTRAMUSCULAR | Status: AC
  Filled 2021-04-17: qty 1

## 2021-04-17 MED ORDER — LABETALOL HCL 5 MG/ML IV SOLN: 40.0000 mg | INTRAVENOUS | Status: DC | PRN

## 2021-04-17 MED ORDER — TRANEXAMIC ACID-NACL 1000-0.7 MG/100ML-% IV SOLN
INTRAVENOUS | Status: AC
  Filled 2021-04-17: qty 100

## 2021-04-17 MED ORDER — HYDRALAZINE HCL 20 MG/ML IJ SOLN: 10.0000 mg | INTRAMUSCULAR | Status: DC | PRN

## 2021-04-17 MED ORDER — MAGNESIUM SULFATE 40 GM/1000ML IV SOLN
INTRAVENOUS | Status: AC
  Administered 2021-04-17: 19:00:00 4 g via INTRAVENOUS
  Filled 2021-04-17: qty 1000

## 2021-04-17 MED ORDER — FLEET ENEMA 7-19 GM/118ML RE ENEM: 1.0000 | ENEMA | Freq: Every day | RECTAL | Status: DC | PRN

## 2021-04-17 MED ORDER — LACTATED RINGERS IV SOLN: INTRAVENOUS | Status: DC

## 2021-04-17 MED ORDER — OXYTOCIN-SODIUM CHLORIDE 30-0.9 UT/500ML-% IV SOLN
2.5000 [IU]/h | INTRAVENOUS | Status: DC
  Administered 2021-04-17: 16:00:00 2.5 [IU]/h via INTRAVENOUS
  Filled 2021-04-17: qty 500

## 2021-04-17 MED ORDER — SIMETHICONE 80 MG PO CHEW: 80.0000 mg | CHEWABLE_TABLET | ORAL | Status: DC | PRN

## 2021-04-17 MED ORDER — SOD CITRATE-CITRIC ACID 500-334 MG/5ML PO SOLN: 30.0000 mL | ORAL | Status: DC | PRN

## 2021-04-17 MED ORDER — IBUPROFEN 600 MG PO TABS
600.0000 mg | ORAL_TABLET | Freq: Four times a day (QID) | ORAL | Status: DC
  Administered 2021-04-17 – 2021-04-18 (×6): 600 mg via ORAL
  Filled 2021-04-17 (×8): qty 1

## 2021-04-17 MED ORDER — DIPHENHYDRAMINE HCL 25 MG PO CAPS: 25.0000 mg | ORAL_CAPSULE | Freq: Four times a day (QID) | ORAL | Status: DC | PRN

## 2021-04-17 MED ORDER — MISOPROSTOL 200 MCG PO TABS
ORAL_TABLET | ORAL | Status: AC
  Filled 2021-04-17: qty 5

## 2021-04-17 MED ORDER — MAGNESIUM SULFATE BOLUS VIA INFUSION
4.0000 g | Freq: Once | INTRAVENOUS | Status: AC
  Filled 2021-04-17: qty 1000

## 2021-04-17 MED ORDER — OXYTOCIN BOLUS FROM INFUSION
333.0000 mL | Freq: Once | INTRAVENOUS | Status: AC
  Administered 2021-04-17: 16:00:00 333 mL via INTRAVENOUS

## 2021-04-17 MED ORDER — LABETALOL HCL 5 MG/ML IV SOLN: 80.0000 mg | INTRAVENOUS | Status: DC | PRN

## 2021-04-17 MED ORDER — MAGNESIUM SULFATE 40 GM/1000ML IV SOLN
2.0000 g/h | INTRAVENOUS | Status: DC
  Administered 2021-04-17: 19:00:00 2 g/h via INTRAVENOUS

## 2021-04-17 MED ORDER — OXYTOCIN-SODIUM CHLORIDE 30-0.9 UT/500ML-% IV SOLN
1.0000 m[IU]/min | INTRAVENOUS | Status: DC
  Administered 2021-04-17: 14:00:00 2 m[IU]/min via INTRAVENOUS

## 2021-04-17 MED ORDER — OXYTOCIN 10 UNIT/ML IJ SOLN
INTRAMUSCULAR | Status: AC
  Filled 2021-04-17: qty 2

## 2021-04-17 MED ORDER — BENZOCAINE-MENTHOL 20-0.5 % EX AERO: 1.0000 "application " | INHALATION_SPRAY | CUTANEOUS | Status: DC | PRN

## 2021-04-17 MED ORDER — TERBUTALINE SULFATE 1 MG/ML IJ SOLN: 0.2500 mg | Freq: Once | INTRAMUSCULAR | Status: DC | PRN

## 2021-04-17 MED ORDER — SENNOSIDES-DOCUSATE SODIUM 8.6-50 MG PO TABS
2.0000 | ORAL_TABLET | ORAL | Status: DC
  Administered 2021-04-18 (×2): 2 via ORAL
  Filled 2021-04-17 (×2): qty 2

## 2021-04-17 MED ORDER — SODIUM CHLORIDE 0.9% FLUSH: 3.0000 mL | Freq: Two times a day (BID) | INTRAVENOUS | Status: DC

## 2021-04-17 MED ORDER — BUTORPHANOL TARTRATE 1 MG/ML IJ SOLN: 1.0000 mg | INTRAMUSCULAR | Status: DC | PRN

## 2021-04-17 MED ORDER — CARBOPROST TROMETHAMINE 250 MCG/ML IM SOLN
INTRAMUSCULAR | Status: AC
  Filled 2021-04-17: qty 1

## 2021-04-17 MED ORDER — OXYCODONE HCL 5 MG PO TABS: 5.0000 mg | ORAL_TABLET | ORAL | Status: DC | PRN

## 2021-04-17 NOTE — OB Triage Note (Signed)
Pt presented to L/D triage with reported contractions that began this morning at 0330 and have increased in frequency and intensity. She rate the pain 5/10. Pt reports no bleeding or LOF and positive fetal movement. Monitors applied and assessing. VSS.  Pt reports no CBG check today. Pt scheduled for IOL tomorrow.

## 2021-04-17 NOTE — Progress Notes (Signed)
Labor Progress Note  Emily [name] is a 40 y.o. G5P4004 at [redacted]w[redacted]d by ultrasound admitted for Painful uterine contractions in latent labor  Subjective: resting in bed, reports pain increasing and now has to breathe through contractions.   Objective: BP 132/76 (BP Location: Right Arm)    Pulse 90    Temp 98.7 F (37.1 C) (Oral)    Resp 18    Ht 5\' 2"  (1.575 m)    Wt 113.4 kg    LMP  (LMP Unknown)    BMI 45.73 kg/m  Notable VS details:   Fetal Assessment: FHT:  FHR: 145 bpm, variability: moderate,  accelerations:  Present,  decelerations:  Present intermittent variables to the 90's with quick return to baseline Category/reactivity:  Category II UC:   regular, every 2-4 minutes SVE:   6/90/-1 Membrane status: AROM Amniotic color: Meconium  Labs: Lab Results  Component Value Date   WBC 10.6 (H) 04/17/2021   HGB 13.5 04/17/2021   HCT 39.2 04/17/2021   MCV 81.5 04/17/2021   PLT 306 04/17/2021    Assessment / Plan: Augmentation of labor, progressing well  Labor:  labor progressing well, at bedside for labor evaluation, discussed AROM with patient and discussed starting low dose Pitocin. Patient agreeable Preeclampsia:  N/A Fetal Wellbeing:  Category II  Pain Control:  IV pain meds as desired by patient I/D:   GBS neg, Arom @ 1345 Anticipated MOD:  NSVD TJS MD aware of plan  Emily [name] 04/19/2021, CNM 04/17/2021, 3:07 PM

## 2021-04-17 NOTE — Telephone Encounter (Signed)
Client [facility] as scheduled in [place] this am. Call to client who states she is not feeling well as having back pain, contractions and feeling like she needs to have a bowel movement but unable to. Client states she is on her way to the hospital. Counseled to call for [facility] appt if does not deliver baby. Jossie Ng, RN

## 2021-04-17 NOTE — Progress Notes (Signed)
Postpartum Day  0  Subjective: no complaints, up ad lib, and voiding  Doing well, no concerns. Ambulating without difficulty, pain managed with PO meds, tolerating regular diet, and voiding without difficulty.   No fever/chills, chest pain, shortness of breath, nausea/vomiting, or leg pain. No nipple or breast pain. No headache, visual changes, or RUQ/epigastric pain.  Objective: BP 131/65 (BP Location: Right Arm)    Pulse 86    Temp 98.7 F (37.1 C) (Oral)    Resp 16    Ht 5\' 2"  (1.575 m)    Wt 113.4 kg    LMP  (LMP Unknown)    SpO2 99%    BMI 45.73 kg/m    Physical Exam:  General: alert, cooperative, and appears stated age Breasts: soft/nontender CV: RRR Pulm: nl effort, CTABL Abdomen: soft, non-tender, active bowel sounds Uterine Fundus: firm Perineum: small amount of edema, intact Lochia: appropriate   Recent Labs    04/17/21 1307 04/17/21 1731  HGB 13.5 13.0  HCT 39.2 37.4  WBC 10.6* 15.9*  PLT 306 301    Assessment/Plan: 40 y.o. G5P4004 postpartum day # 0, patient begin having elevated BP's ranging from 130's-160's/70-100's. PIH labs completed. CBC WNL with exception of WBC's 15, CMP WNL, PCR 0.43. Postpartum Mag Sulfate ordered with 4g bolus/2 gm per hour. Patient started on Procardia Xl 30 mg daily,foley cath placed.  Patient denies any s/s of pre-e and agreeable with POC. TJS notified of current labs and agrees to plan    04/19/21 Certified Nurse Midwife [facility]
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
## 2021-04-17 NOTE — H&P (Signed)
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                Previously seen  Heart:                 Previously seen        Spine:                  Previously seen  RVOT:                  Previously seen        Upper Extremities:      Not well visualized  LVOT:                  Previously seen        Lower Extremities:       Previously seen  Other:  Technicallly difficult due to advanced GA and maternal habitus. ---------------------------------------------------------------------- Cervix Uterus Adnexa  Cervix  Not visualized (advanced GA >24wks) ---------------------------------------------------------------------- Impression  Follow up growth due to A2GDM and elevated BMI.  Normal interval growth with measurements consistent with  dates  Good fetal movement and amniotic fluid volume  Biophysical profile 8/8  Suboptimal views of the fetal anatomy obtained secondary to  fetal position and advanced gestational age. ---------------------------------------------------------------------- Recommendations  Continue weekly testing until delivered  Consider delivery at 39 weeks. ----------------------------------------------------------------------               Sander Nephew, MD Electronically Signed Final Report   04/11/2021 12:31 pm ----------------------------------------------------------------------  [place] MFM OB LIMITED  Result Date: 03/29/2021 ----------------------------------------------------------------------  OBSTETRICS REPORT                    (Corrected Final 03/29/2021 09:12 am) ---------------------------------------------------------------------- Patient Info  ID #:       [id number]  D.O.B.:  1980-05-11 (40 yrs)  Name:       Emily [name]                 Visit Date: 03/20/2021 10:20 am              [name] ---------------------------------------------------------------------- Performed By  Attending:        Sander Nephew      Referred By:      [place]                                                             Department  Performed By:     Germain Osgood            [facility] at                                                              [facility] ---------------------------------------------------------------------- Orders  #  Description                           Code        Ordered By  1  [place] MFM OB LIMITED                     [id number]    Sander Nephew ----------------------------------------------------------------------  #  Order #                     Accession #                Episode #  1  [id number]                   [id number]                 [id number] ---------------------------------------------------------------------- Indications  Advanced maternal age multigravida 80+,        O3.522  second trimester  Obesity complicating pregnancy, second         O99.212  trimester  Poor obstetric history: Previous fetal growth  O09.299  restriction (FGR)  Gestational diabetes in pregnancy,             O24.415  controlled by oral hypoglycemic drugs  [redacted] weeks gestation of pregnancy                Z3A.35 ---------------------------------------------------------------------- Fetal Evaluation  Num Of Fetuses:         1  Fetal Heart Rate(bpm):  143  Cardiac Activity:       Observed  Presentation:           Cephalic  Placenta:               Left lateral  P. Cord Insertion:      Previously Visualized  Amniotic Fluid  AFI FV:      Subjectively decreased  AFI Sum(cm)     %Tile       Largest Pocket(cm)  7.8             4.8         2.7  RUQ(cm)       RLQ(cm)       LUQ(cm)        LLQ(cm)  0.8           2.2           2.1            2.7 ---------------------------------------------------------------------- OB History  Gravidity:    5         Term:   4  Living:       4 ---------------------------------------------------------------------- Gestational Age  Best:          35w 0d     Det. ByMarcella Dubs         EDD:   04/24/21                                      (09/06/20) ---------------------------------------------------------------------- Anatomy  Diaphragm:             Appears  normal         Kidneys:                Appear normal  Stomach:               Appears normal, left   Bladder:                Appears normal                         sided ---------------------------------------------------------------------- Impression  Limited exam due to A2GDM  Objectively normal amniotic fluid was observed, however, the  amniotic fluid appeared subjective decreased. ---------------------------------------------------------------------- Recommendations  Continue 2x weekly testing ----------------------------------------------------------------------                    Lin Landsman, MD Electronically Signed Corrected Final Report  03/29/2021 09:12 am ----------------------------------------------------------------------   Assessment:  Eimi Viney is a 40 y.o. G5P4004 female at [redacted]w[redacted]d with A1GDM , obesity, AMA, Elevated BP.   Plan:  1. Admit to Labor & Delivery; consents reviewed and obtained - Covid admission screen   2. Fetal Well being  - Fetal Tracing: Category 2 with intermittent variables - Group B Streptococcus ppx not indicated: GBS neg - Presentation: vertex confirmed by [place] and SVE   3. Routine OB: -  Prenatal labs reviewed, as above - Rh pos - CBC, T&S, RPR on admit - Clear fluids, IVF  4. Monitoring of labor  - Contractions monitored with external toco - Pelvis proven to 7lbs  - Plan for  expectant management, will reassess - Augmentation with oxytocin and AROM as appropriate  - Plan for  continuous fetal monitoring - Maternal pain control as desired; planning IVPM - Anticipate vaginal delivery  5. Post Partum Planning: - Infant feeding: Breast/bottle - Contraception: BTL, or paragard - Tdap vaccine: 01/31/21 - Flu vaccine: [id number]   [place] Certified Nurse Midwife [facility]

## 2021-04-18 LAB — CBC
HCT: 35.4 % — ABNORMAL LOW (ref 36.0–46.0)
Hemoglobin: 12.2 g/dL (ref 12.0–15.0)
MCH: 28.6 pg (ref 26.0–34.0)
MCHC: 34.5 g/dL (ref 30.0–36.0)
MCV: 82.9 fL (ref 80.0–100.0)
Platelets: 271 10*3/uL (ref 150–400)
RBC: 4.27 MIL/uL (ref 3.87–5.11)
RDW: 13.9 % (ref 11.5–15.5)
WBC: 9.7 10*3/uL (ref 4.0–10.5)
nRBC: 0 % (ref 0.0–0.2)

## 2021-04-18 LAB — TYPE AND SCREEN
ABO/RH(D): O POS
Antibody Screen: NEGATIVE

## 2021-04-18 LAB — GLUCOSE, CAPILLARY: Glucose-Capillary: 108 mg/dL — ABNORMAL HIGH (ref 70–99)

## 2021-04-18 LAB — MAGNESIUM: Magnesium: 4.1 mg/dL — ABNORMAL HIGH (ref 1.7–2.4)

## 2021-04-18 MED ORDER — NIFEDIPINE ER OSMOTIC RELEASE 30 MG PO TB24
30.0000 mg | ORAL_TABLET | Freq: Every day | ORAL | Status: DC
  Administered 2021-04-18 – 2021-04-19 (×2): 30 mg via ORAL
  Filled 2021-04-18 (×2): qty 1

## 2021-04-18 MED ORDER — LACTATED RINGERS IV SOLN: INTRAVENOUS | Status: DC

## 2021-04-18 NOTE — Progress Notes (Signed)
Post Partum Day 1 Subjective: Doing well, no complaints.  Tolerating regular diet, pain with PO meds, voiding and ambulating without difficulty.  No CP SOB Fever,Chills, N/V or leg pain; denies nipple or breast pain; no HA change of vision, RUQ/epigastric pain  Objective: BP 140/62    Pulse 82    Temp 97.9 F (36.6 C) (Oral)    Resp 18    Ht 5\' 2"  (1.575 m)    Wt 113.4 kg    LMP  (LMP Unknown)    SpO2 98%    Breastfeeding Unknown    BMI 45.73 kg/m    Physical Exam:  General: NAD Breasts: soft/nontender CV: RRR Pulm: nl effort, CTABL Perineum: minimal edema, intact Lochia: moderate Uterine Fundus: fundus firm and 1 fb below umbilicus DVT Evaluation: no cords, ttp LEs   Recent Labs    04/17/21 1731 04/18/21 0758  HGB 13.0 12.2  HCT 37.4 35.4*  WBC 15.9* 9.7  PLT 301 271    Assessment/Plan: 40 y.o. 20 postpartum day # 1  - Continue routine PP care - Lactation consult prn  - Mag DC after 12hrs, one mild range BP noted this am. Procardia 30mg  XL daily ordered.  - Discussed contraceptive options including implant, IUDs hormonal and non-hormonal, injection, pills/ring/patch, condoms, and NFP.  - Acute blood loss anemia - hemodynamically stable and asymptomatic; start po ferrous sulfate BID with stool softeners  - Immunization status: all Imms up to date    Disposition: Does not desire Dc home today.     [id number] Emily [name], CNM @TODAY @ 8:33 AM

## 2021-04-18 NOTE — Lactation Note (Signed)
This note was copied from a baby's chart. Lactation Consultation Note  Patient Name: Emily [name] [id number] Date: 04/18/2021 Reason for consult: Initial assessment;Term Age:37 hours  Initial lactation visit. Mom is P5 SVD 23 hours ago. Mom remained in L&D until this afternoon due to increase blood pressure and administration of Mag2+.  Baby active at breast upon entry, baby showing rhythmic sucking pattern, good positioning and alignment, mom in no pain or discomfort. Mom reports BF all other children ranging from 6 months to a year and will follow the same with [facility].  Reminded mom of newborn feeding patterns and behaviors, potential for cluster feeding, on demand feedings, output, and milk supply and demand.  Mom has no concerns at this time and is independent and confidence in her feedings.  Maternal Data Has patient been taught Hand Expression?: Yes Does the patient have breastfeeding experience prior to this delivery?: Yes How long did the patient breastfeed?: 6 months- 1 year  Feeding Mother's Current Feeding Choice: Breast Milk and Formula  LATCH Score Latch:  (mom was already actively feeding)                  Lactation Tools Discussed/Used    Interventions Interventions: Breast feeding basics reviewed;Education  Discharge    Consult Status Consult Status: Follow-up Date: 04/19/21 Follow-up type: In-patient    Danford Bad 04/18/2021, 3:27 PM

## 2021-04-19 MED ORDER — NIFEDIPINE ER 30 MG PO TB24
30.0000 mg | ORAL_TABLET | Freq: Every day | ORAL | 0 refills | Status: DC
Start: 2021-04-19 — End: 2023-05-26

## 2021-04-19 MED ORDER — WITCH HAZEL-GLYCERIN EX PADS: 1.0000 "application " | MEDICATED_PAD | CUTANEOUS | 12 refills | Status: DC | PRN

## 2021-04-19 MED ORDER — BENZOCAINE-MENTHOL 20-0.5 % EX AERO: 1.0000 "application " | INHALATION_SPRAY | CUTANEOUS | Status: DC | PRN

## 2021-04-19 MED ORDER — IBUPROFEN 600 MG PO TABS: 600.0000 mg | ORAL_TABLET | Freq: Four times a day (QID) | ORAL | 0 refills | Status: DC

## 2021-04-19 MED ORDER — ACETAMINOPHEN 325 MG PO TABS: 650.0000 mg | ORAL_TABLET | ORAL | Status: DC | PRN

## 2021-04-19 NOTE — Progress Notes (Signed)
Pt discharged with infant. Discharge instructions, prescriptions, and follow up appointments given to and reviewed with patient. Pt verbalized understanding. To be escorted out by auxillary.  °

## 2021-04-19 NOTE — Lactation Note (Signed)
This note was copied from a baby's chart. Lactation Consultation Note  Patient Name: Emily [name] [id number] Date: 04/19/2021 Reason for consult: Follow-up assessment;Term Age:38 hours  Lactation follow-up prior to anticipated discharge. Baby continued to feed well overnight, output exceeds expectations, and mom has no concerns. Reviewed anticipated breast changes, management of breast fullness and engorgement and nipple care. Encouraged continued on demand feedings, reviewed cluster feeding and growth spurts, and continued output expectations. Mom, this being her 5th baby, expresses confidence in breastfeeding and has no questions. Information given for outpt services and community support.  Maternal Data Has patient been taught Hand Expression?: Yes Does the patient have breastfeeding experience prior to this delivery?: Yes How long did the patient breastfeed?: 76mo-24yr  Feeding Mother's Current Feeding Choice: Breast Milk and Formula  LATCH Score                    Lactation Tools Discussed/Used    Interventions Interventions: Education;Pre-pump if needed  Discharge Discharge Education: Engorgement and breast care;Warning signs for feeding baby;Outpatient recommendation  Consult Status Consult Status: Complete    Danford Bad 04/19/2021, 9:04 AM

## 2021-06-04 ENCOUNTER — Ambulatory Visit: Payer: Self-pay

## 2021-06-13 ENCOUNTER — Telehealth: Payer: Self-pay | Admitting: Family Medicine

## 2021-06-13 NOTE — Telephone Encounter (Signed)
Called pt to rescheduled missed PP appointment. Pt states that she has no transportation and she will call us to reschedule when she does.
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
## 2021-06-21 ENCOUNTER — Ambulatory Visit: Payer: Self-pay

## 2022-10-23 IMAGING — US US MFM OB FOLLOW-UP
1 series · 14 of 19 positions shown · non-contrast
Comparison: none

[Series 1: us mfm ob follow-up · 19 acquisitions, 14 frames shown]
[im 1/19]
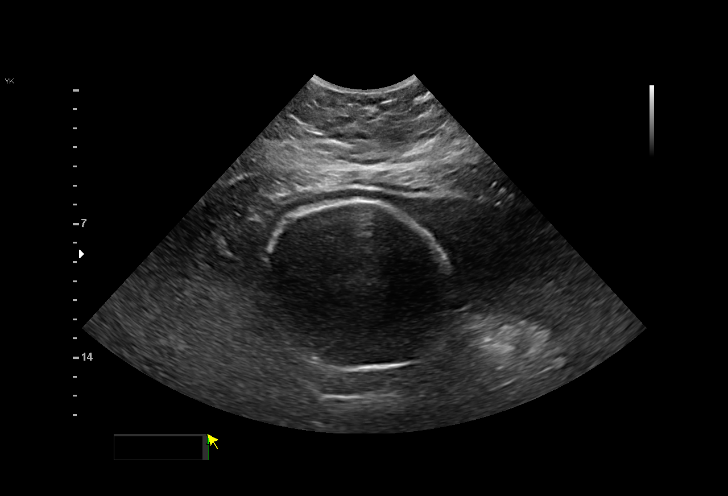
[im 3/19]
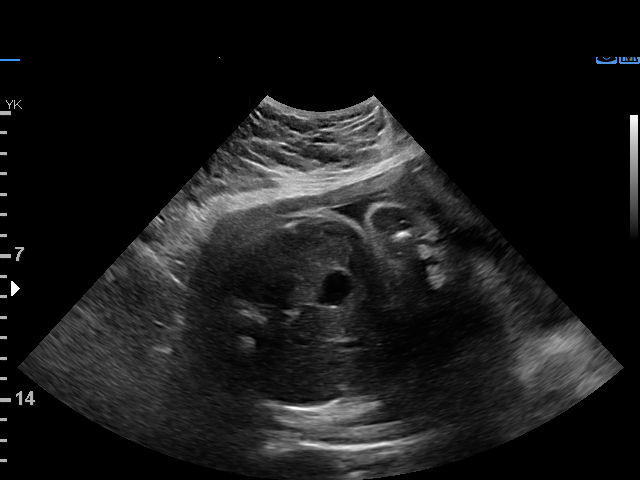
[im 4/19]
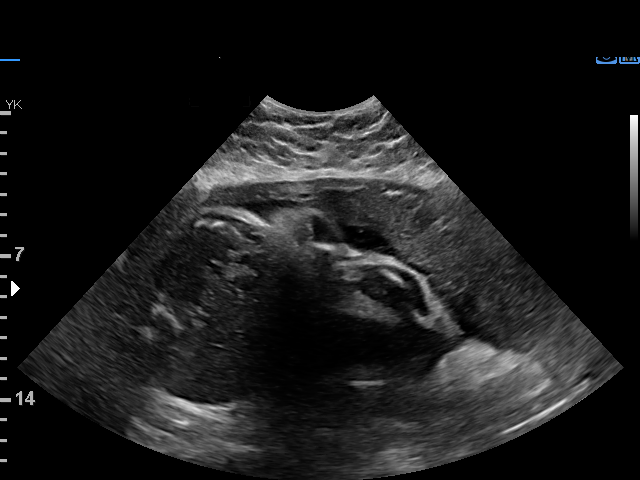
[im 5/19]
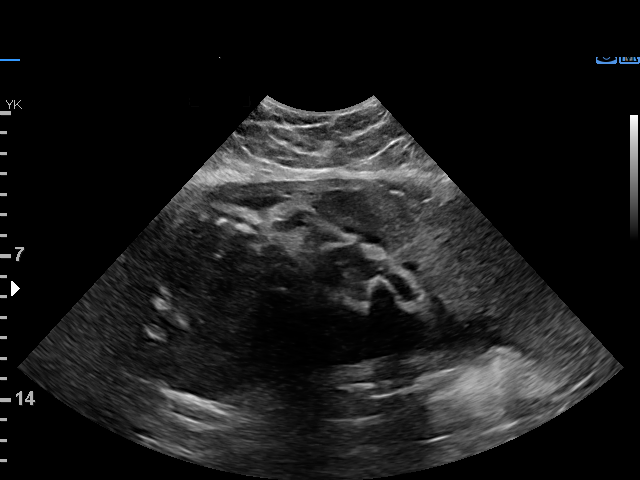
[im 7/19]
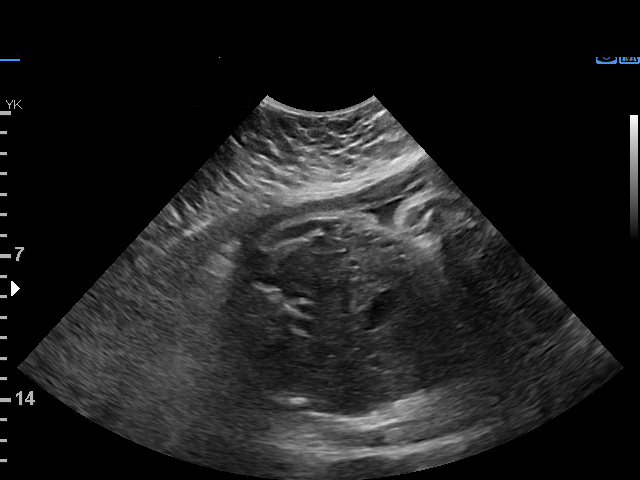
[im 8/19]
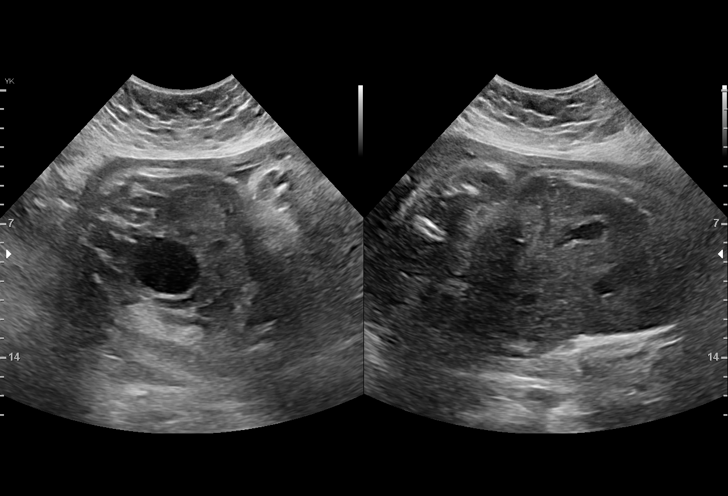
[im 9/19]
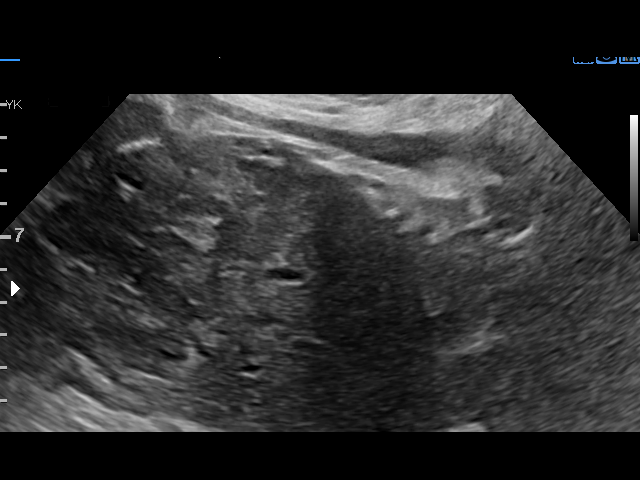
[im 11/19]
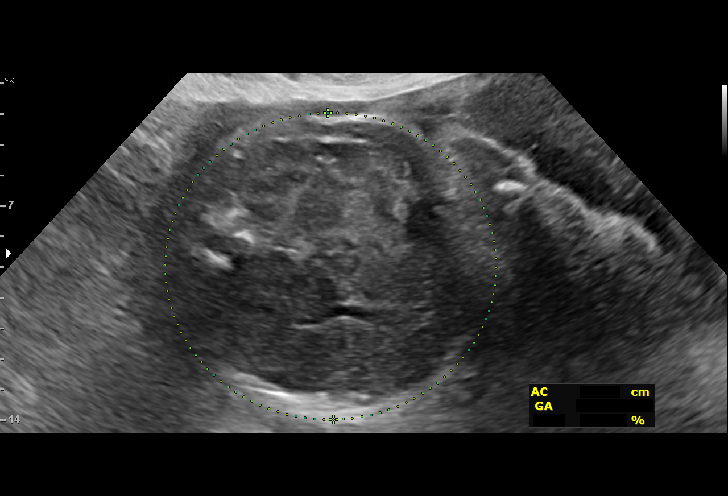
[im 12/19]
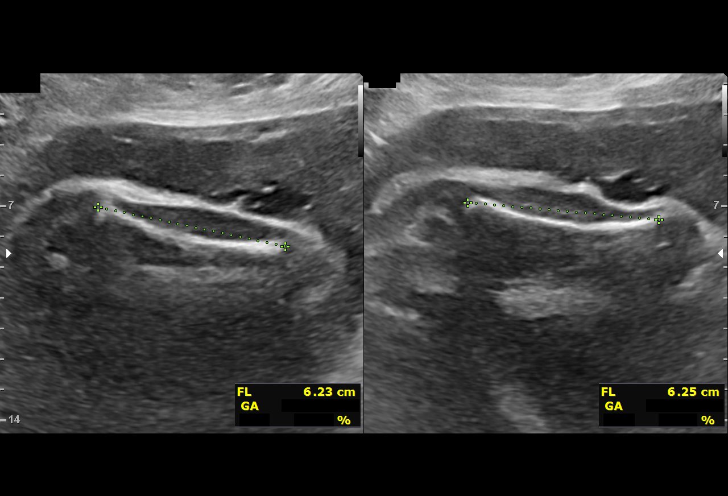
[im 13/19]
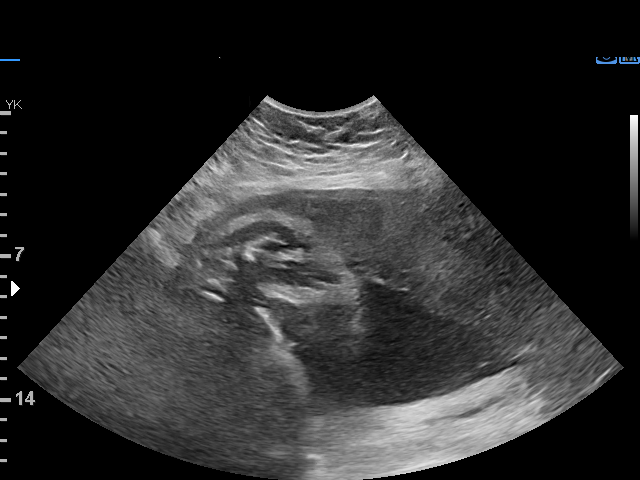
[im 15/19]
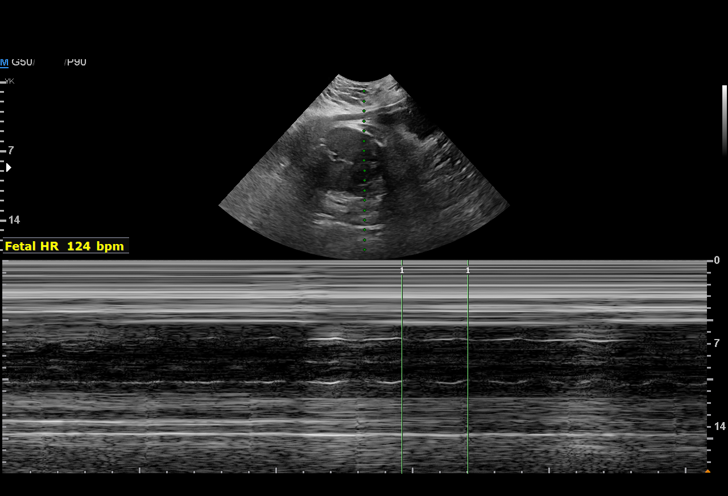
[im 16/19]
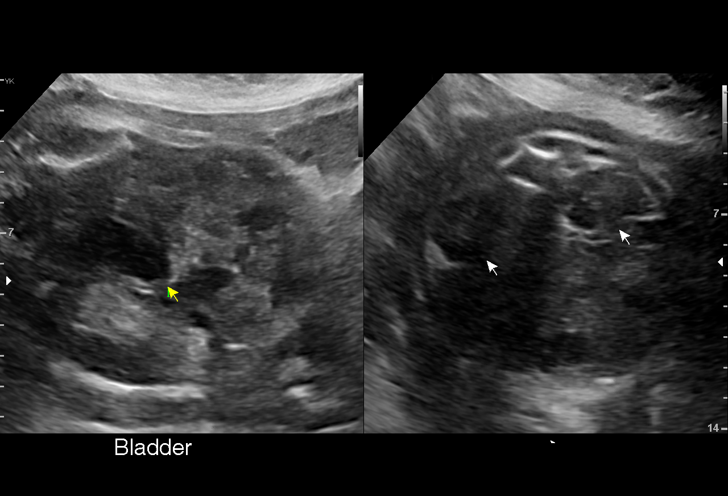
[im 17/19]
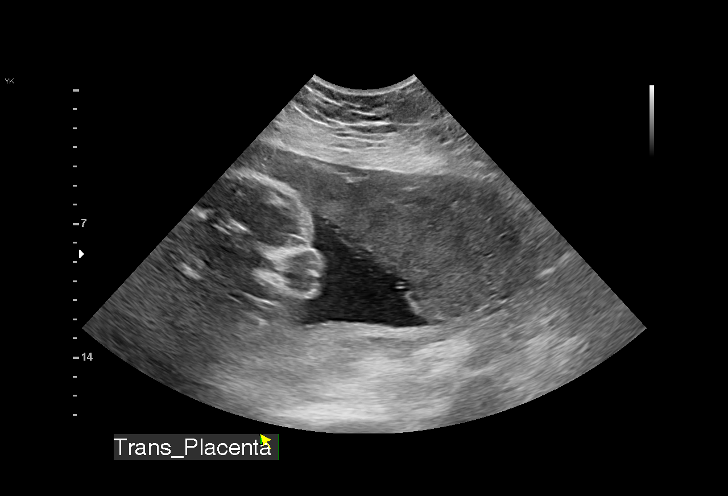
[im 19/19]
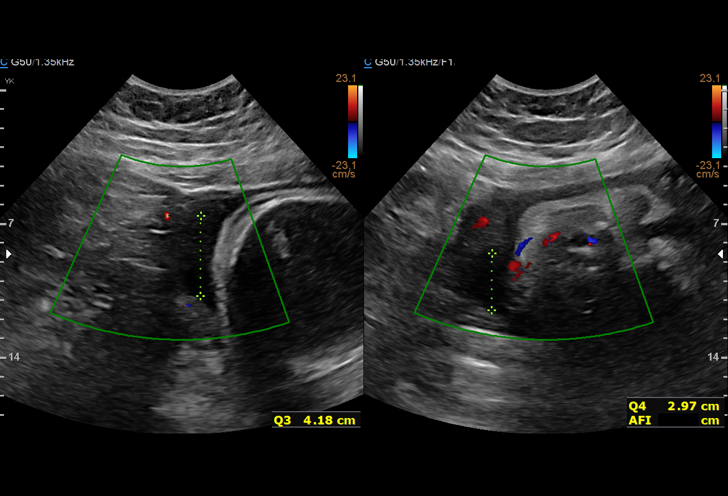

[14 of 19 positions shown; findings below may reference images not displayed]

[facility]

                   [facility]                                       [facility]
                                                            Department

                                                      ELLE[facility]

Indications

 Advanced maternal age multigravida 35+,
 second trimester
 Obesity complicating pregnancy, second
 trimester
 Poor obstetric history: Previous fetal growth
 restriction (FGR)
 Gestational diabetes in pregnancy,
 controlled by oral hypoglycemic drugs
 33 weeks gestation of pregnancy
Fetal Evaluation

 Num Of Fetuses:         1
 Fetal Heart Rate(bpm):  143
 Cardiac Activity:       Observed
 Presentation:           Cephalic
 Placenta:               Left lateral

 Amniotic Fluid
 AFI FV:      Within normal limits

 AFI Sum(cm)     %Tile       Largest Pocket(cm)
 13.7            46

 RUQ(cm)       RLQ(cm)       LUQ(cm)        LLQ(cm)
 3.3           3
Biophysical Evaluation

 Amniotic F.V:   Within normal limits       F. Tone:        Observed
 F. Movement:    Observed                   Score:          [DATE]
 F. Breathing:   Observed
Biometry

 BPD:      85.9  mm     G. Age:  34w 4d         82  %    CI:        72.88   %    70 - 86
                                                         FL/HC:      19.3   %    19.9 -
 HC:      319.9  mm     G. Age:  36w 0d         83  %    HC/AC:      0.99        0.96 -
 AC:      323.4  mm     G. Age:  36w 2d         99  %    FL/BPD:     71.7   %    71 - 87
 FL:       61.6  mm     G. Age:  32w 0d         11  %    FL/AC:      19.0   %    20 - 24

 Est. FW:    4001  gm    5 lb 10 oz      88  %
OB History

 Gravidity:    5         Term:   4
 Living:       4
Gestational Age

 U/S Today:     34w 5d                                        EDD:   04/14/21
 Best:          33w 2d     Det. By:  Early Ultrasound         EDD:   04/24/21
                                     (09/06/20)
Anatomy

 Cranium:               Appears normal         Aortic Arch:            Not well visualized
 Cavum:                 Previously seen        Ductal Arch:            Not well visualized
 Ventricles:            Appears normal         Diaphragm:              Previously seen
 Choroid Plexus:        Previously seen        Stomach:                Appears normal, left
                                                                       sided
 Cerebellum:            Previously seen        Abdomen:                Previously seen
 Posterior Fossa:       Previously seen        Abdominal Wall:         Previously seen
 Nuchal Fold:           Previously seen        Cord Vessels:           Previously seen
 Face:                  Orbits and profile     Kidneys:                Appear normal
                        previously seen
 Lips:                  Not well visualized    Bladder:                Appears normal
 Heart:                 Previously seen        Spine:                  Previously seen
 RVOT:                  Previously seen        Upper Extremities:      Not well visualized
 LVOT:                  Previously seen        Lower Extremities:      Previously seen

 Other:  Technicallly difficult due to advanced GA and maternal habitus.
Impression

 Follow up growth due to elevated BMI and A2 GDM
 Normal interval growth with measurements consistent with
 dates
 Good fetal movement and amniotic fluid volume
 Suboptimal views of the fetal anatomy was again observed
 secondary to fetal position and maternal habitus.

 She reports good blood sugars taking metformin 2x daily.
Recommendations

 Follow up growth in 4 weeks
 2x weekly testing has been scheduled

## 2023-05-26 ENCOUNTER — Encounter: Payer: Self-pay | Admitting: Advanced Practice Midwife

## 2023-05-26 ENCOUNTER — Ambulatory Visit: Payer: Medicaid Other | Admitting: Advanced Practice Midwife

## 2023-05-26 VITALS — BP 113/71 | HR 80 | Temp 97.8°F | Wt 252.4 lb

## 2023-05-26 DIAGNOSIS — O99012 Anemia complicating pregnancy, second trimester: Secondary | ICD-10-CM

## 2023-05-26 DIAGNOSIS — Z23 Encounter for immunization: Secondary | ICD-10-CM

## 2023-05-26 DIAGNOSIS — O0932 Supervision of pregnancy with insufficient antenatal care, second trimester: Secondary | ICD-10-CM | POA: Diagnosis not present

## 2023-05-26 DIAGNOSIS — O99210 Obesity complicating pregnancy, unspecified trimester: Secondary | ICD-10-CM | POA: Insufficient documentation

## 2023-05-26 DIAGNOSIS — O09529 Supervision of elderly multigravida, unspecified trimester: Secondary | ICD-10-CM | POA: Insufficient documentation

## 2023-05-26 DIAGNOSIS — O0992 Supervision of high risk pregnancy, unspecified, second trimester: Secondary | ICD-10-CM | POA: Diagnosis not present

## 2023-05-26 DIAGNOSIS — O99212 Obesity complicating pregnancy, second trimester: Secondary | ICD-10-CM

## 2023-05-26 DIAGNOSIS — O0930 Supervision of pregnancy with insufficient antenatal care, unspecified trimester: Secondary | ICD-10-CM | POA: Insufficient documentation

## 2023-05-26 DIAGNOSIS — O99019 Anemia complicating pregnancy, unspecified trimester: Secondary | ICD-10-CM

## 2023-05-26 LAB — URINALYSIS
Bilirubin, UA: NEGATIVE
Glucose, UA: NEGATIVE
Ketones, UA: NEGATIVE
Leukocytes,UA: NEGATIVE
Nitrite, UA: NEGATIVE
Protein,UA: NEGATIVE
RBC, UA: NEGATIVE
Specific Gravity, UA: 1.015 (ref 1.005–1.030)
Urobilinogen, Ur: 0.2 mg/dL (ref 0.2–1.0)
pH, UA: 7 (ref 5.0–7.5)

## 2023-05-26 LAB — WET PREP FOR TRICH, YEAST, CLUE
Trichomonas Exam: NEGATIVE
Yeast Exam: NEGATIVE

## 2023-05-26 LAB — HEMOGLOBIN, FINGERSTICK: Hemoglobin: 10.5 g/dL — ABNORMAL LOW (ref 11.1–15.9)

## 2023-05-26 MED ORDER — IRON (FERROUS SULFATE) 325 (65 FE) MG PO TABS: 1.0000 | ORAL_TABLET | Freq: Every day | ORAL | Status: AC

## 2023-05-26 MED ORDER — ASPIRIN 81 MG PO TBEC: 81.0000 mg | DELAYED_RELEASE_TABLET | Freq: Every day | ORAL | Status: DC

## 2023-05-26 MED ORDER — ASPIRIN 81 MG PO TBEC: 81.0000 mg | DELAYED_RELEASE_TABLET | Freq: Every day | ORAL | Status: AC
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
## 2023-05-26 NOTE — Progress Notes (Addendum)
Presents for initiation of prenatal care. Prefers Spanish literature. Pregnancy verified by Dr. Toya Smothers and report in outguide. All demographic and contact information verified. Per paper chart, 06/18/2016 PPDR =  0 mm and client denies international travel since that time. Copy of PPDR printed from [facility] and in outguide for scanning. Desires MaterniT-21 and AFP testing today. Normal sickle cell testing 10/17/20 and printed from [facility] for scanning. 05/14/16 MMR titer printed from [facility] and in outguide for scanning. Varicella immune by titer 01/29/2008 and paper result copied and in outguide for scanning. Jossie Ng, RN Desires BTL. [facility] Financial Assistance Application with instruction sheet provided to client and counseled to complete / submit ASAP. Jossie Ng, RN Wet prep negative and no intervention required per standing order. Hgb = 10.5 and iron initiated per standing orders. Counseled on how to take iron tablet and prenatal vitamin. Client verbalized understanding. Client counseled [place] and genetic counseling appt scheduled for 06/12/23 with arrival time of 1315 at MFM in [place] [employer]. Appt reminder card with facility address provided to client. Client preferred appt in [place], but no appts available until March at which time presumptive Medicaid will be expired. Jossie Ng, RN 4 week [facility] RV appt scheduled and reminder card given to client. Jossie Ng, RN

## 2023-05-27 LAB — PROTEIN / CREATININE RATIO, URINE
Creatinine, Urine: 68.5 mg/dL
Protein, Ur: 10.8 mg/dL
Protein/Creat Ratio: 158 mg/g{creat} (ref 0–200)

## 2023-05-31 LAB — TOXASSURE FLEX 15, UR
6-ACETYLMORPHINE IA: NEGATIVE ng/mL
7-aminoclonazepam: NOT DETECTED ng/mg{creat}
AMPHETAMINES IA: NEGATIVE ng/mL
Alpha-hydroxyalprazolam: NOT DETECTED ng/mg{creat}
Alpha-hydroxymidazolam: NOT DETECTED ng/mg{creat}
Alpha-hydroxytriazolam: NOT DETECTED ng/mg{creat}
Alprazolam: NOT DETECTED ng/mg{creat}
BARBITURATES IA: NEGATIVE ng/mL
BUPRENORPHINE: NEGATIVE
Benzodiazepines: NEGATIVE
Buprenorphine: NOT DETECTED ng/mg{creat}
CANNABINOIDS IA: NEGATIVE ng/mL
COCAINE METABOLITE IA: NEGATIVE ng/mL
Clonazepam: NOT DETECTED ng/mg{creat}
Creatinine: 67 mg/dL
Desalkylflurazepam: NOT DETECTED ng/mg{creat}
Desmethyldiazepam: NOT DETECTED ng/mg{creat}
Desmethylflunitrazepam: NOT DETECTED ng/mg{creat}
Diazepam: NOT DETECTED ng/mg{creat}
ETHYL ALCOHOL Enzymatic: NEGATIVE g/dL
FENTANYL: NEGATIVE
Fentanyl: NOT DETECTED ng/mg{creat}
Flunitrazepam: NOT DETECTED ng/mg{creat}
Lorazepam: NOT DETECTED ng/mg{creat}
METHADONE IA: NEGATIVE ng/mL
METHADONE MTB IA: NEGATIVE ng/mL
Midazolam: NOT DETECTED ng/mg{creat}
Norbuprenorphine: NOT DETECTED ng/mg{creat}
Norfentanyl: NOT DETECTED ng/mg{creat}
OPIATE CLASS IA: NEGATIVE ng/mL
OXYCODONE CLASS IA: NEGATIVE ng/mL
Oxazepam: NOT DETECTED ng/mg{creat}
PHENCYCLIDINE IA: NEGATIVE ng/mL
TAPENTADOL, IA: NEGATIVE ng/mL
TRAMADOL IA: NEGATIVE ng/mL
Temazepam: NOT DETECTED ng/mg{creat}

## 2023-05-31 LAB — MATERNIT 21 PLUS CORE, BLOOD
Fetal Fraction: 19
Result (T21): NEGATIVE
Trisomy 13 (Patau syndrome): NEGATIVE
Trisomy 18 (Edwards syndrome): NEGATIVE
Trisomy 21 (Down syndrome): NEGATIVE

## 2023-05-31 LAB — PREGNANCY, INITIAL SCREEN
Antibody Screen: NEGATIVE
Basophils Absolute: 0 10*3/uL (ref 0.0–0.2)
Basos: 0 %
Bilirubin, UA: NEGATIVE
Chlamydia trachomatis, NAA: NEGATIVE
EOS (ABSOLUTE): 0.1 10*3/uL (ref 0.0–0.4)
Eos: 1 %
Glucose, UA: NEGATIVE
HCV Ab: NONREACTIVE
HIV Screen 4th Generation wRfx: NONREACTIVE
Hematocrit: 34.3 % (ref 34.0–46.6)
Hemoglobin: 10.5 g/dL — ABNORMAL LOW (ref 11.1–15.9)
Hepatitis B Surface Ag: NEGATIVE
Immature Grans (Abs): 0 10*3/uL (ref 0.0–0.1)
Immature Granulocytes: 0 %
Ketones, UA: NEGATIVE
Leukocytes,UA: NEGATIVE
Lymphocytes Absolute: 2.2 10*3/uL (ref 0.7–3.1)
Lymphs: 24 %
MCH: 23.2 pg — ABNORMAL LOW (ref 26.6–33.0)
MCHC: 30.6 g/dL — ABNORMAL LOW (ref 31.5–35.7)
MCV: 76 fL — ABNORMAL LOW (ref 79–97)
Monocytes Absolute: 0.4 10*3/uL (ref 0.1–0.9)
Monocytes: 5 %
Neisseria Gonorrhoeae by PCR: NEGATIVE
Neutrophils Absolute: 6.1 10*3/uL (ref 1.4–7.0)
Neutrophils: 70 %
Nitrite, UA: NEGATIVE
Platelets: 290 10*3/uL (ref 150–450)
Protein,UA: NEGATIVE
RBC, UA: NEGATIVE
RBC: 4.52 x10E6/uL (ref 3.77–5.28)
RDW: 17 % — ABNORMAL HIGH (ref 11.7–15.4)
RPR Ser Ql: NONREACTIVE
Rh Factor: POSITIVE
Rubella Antibodies, IGG: 3.64 {index} (ref >0.99)
Specific Gravity, UA: 1.012 (ref 1.005–1.030)
Urobilinogen, Ur: 0.2 mg/dL (ref 0.2–1.0)
WBC: 8.9 10*3/uL (ref 3.4–10.8)
pH, UA: 7 (ref 5.0–7.5)

## 2023-05-31 LAB — FE+CBC/D/PLT+TIBC+FER+RETIC
Ferritin: 8 ng/mL — ABNORMAL LOW (ref 15–150)
Iron Saturation: 8 % — CL (ref 15–55)
Iron: 45 ug/dL (ref 27–159)
Retic Ct Pct: 1.5 % (ref 0.6–2.6)
Total Iron Binding Capacity: 584 ug/dL (ref 250–450)
UIBC: 539 ug/dL — ABNORMAL HIGH (ref 131–425)

## 2023-05-31 LAB — TSH: TSH: 1.34 u[IU]/mL (ref 0.450–4.500)

## 2023-05-31 LAB — MICROSCOPIC EXAMINATION
Bacteria, UA: NONE SEEN
Casts: NONE SEEN /[LPF]
WBC, UA: NONE SEEN /[HPF] (ref 0–5)

## 2023-05-31 LAB — HGB A1C W/O EAG: Hgb A1c MFr Bld: 5.8 % — ABNORMAL HIGH (ref 4.8–5.6)

## 2023-05-31 LAB — COMPREHENSIVE METABOLIC PANEL
ALT: 10 [IU]/L (ref 0–32)
AST: 14 [IU]/L (ref 0–40)
Albumin: 3.6 g/dL — ABNORMAL LOW (ref 3.9–4.9)
Alkaline Phosphatase: 69 [IU]/L (ref 44–121)
BUN/Creatinine Ratio: 11 (ref 9–23)
BUN: 6 mg/dL (ref 6–24)
Bilirubin Total: 0.5 mg/dL (ref 0.0–1.2)
CO2: 19 mmol/L — ABNORMAL LOW (ref 20–29)
Calcium: 8.9 mg/dL (ref 8.7–10.2)
Chloride: 100 mmol/L (ref 96–106)
Creatinine, Ser: 0.56 mg/dL — ABNORMAL LOW (ref 0.57–1.00)
Globulin, Total: 2.5 g/dL (ref 1.5–4.5)
Glucose: 175 mg/dL — ABNORMAL HIGH (ref 70–99)
Potassium: 4.2 mmol/L (ref 3.5–5.2)
Sodium: 136 mmol/L (ref 134–144)
Total Protein: 6.1 g/dL (ref 6.0–8.5)
eGFR: 118 mL/min/{1.73_m2} (ref >59)

## 2023-05-31 LAB — AFP, SERUM, OPEN SPINA BIFIDA
AFP MoM: 0.86
AFP Value: 34.8 ng/mL
Gest. Age on Collection Date: 19.9 wk
Maternal Age At EDD: 41 a
OSBR Risk 1 IN: 10000
Test Results:: NEGATIVE
Weight: 252 [lb_av]

## 2023-05-31 LAB — LEAD, BLOOD (ADULT >= 16 YRS): Lead-Whole Blood: <1 ug/dL (ref 0.0–3.4)

## 2023-05-31 LAB — URINE CULTURE, OB REFLEX

## 2023-05-31 LAB — GLUCOSE, 1 HOUR GESTATIONAL: Gestational Diabetes Screen: 162 mg/dL — ABNORMAL HIGH (ref 70–139)

## 2023-05-31 LAB — HCV INTERPRETATION

## 2023-06-02 ENCOUNTER — Encounter: Payer: Self-pay | Admitting: Advanced Practice Midwife

## 2023-06-02 ENCOUNTER — Telehealth: Payer: Self-pay

## 2023-06-02 DIAGNOSIS — O99810 Abnormal glucose complicating pregnancy: Secondary | ICD-10-CM | POA: Insufficient documentation

## 2023-06-02 LAB — IGP, APTIMA HPV
HPV Aptima: NEGATIVE
PAP Smear Comment: 0

## 2023-06-02 NOTE — Telephone Encounter (Signed)
Call to client and counseled on need for 3 hour GTT as 1 hour glucola test result = 162. Appt for 3 hour GTT scheduled for 06/03/23 am. Test prep instructions reviewed with client, questions answered and client able to correctly verbalize instructions. Jossie Ng, RN

## 2023-06-03 ENCOUNTER — Ambulatory Visit: Payer: Self-pay | Admitting: Advanced Practice Midwife

## 2023-06-03 VITALS — BP 102/67 | HR 79 | Temp 97.0°F | Wt 254.8 lb

## 2023-06-03 DIAGNOSIS — O99810 Abnormal glucose complicating pregnancy: Secondary | ICD-10-CM

## 2023-06-03 NOTE — Progress Notes (Signed)
 Correctly verbalizes how to take prenatal vitamin and iron  tablet. States taking iron  tablet with juice. Reports last solid intake was at 2000 last pm with [place] to drink. Reports has had a few small sips of water since midnight. Aware of continuing 3 hour GTT test prep instructions. Counseled to return to [facility] for questions / problems. Aware of next [facility] appt. Burnadette Lowers, RN

## 2023-06-04 LAB — GESTATIONAL GLUCOSE TOLERANCE
Glucose, Fasting: 102 mg/dL — ABNORMAL HIGH (ref 70–94)
Glucose, GTT - 1 Hour: 222 mg/dL — ABNORMAL HIGH (ref 70–179)
Glucose, GTT - 2 Hour: 98 mg/dL (ref 70–154)
Glucose, GTT - 3 Hour: 73 mg/dL (ref 70–139)

## 2023-06-05 ENCOUNTER — Encounter: Payer: Self-pay | Admitting: Advanced Practice Midwife

## 2023-06-05 ENCOUNTER — Other Ambulatory Visit: Payer: Self-pay | Admitting: Advanced Practice Midwife

## 2023-06-05 ENCOUNTER — Telehealth: Payer: Self-pay

## 2023-06-05 DIAGNOSIS — O24419 Gestational diabetes mellitus in pregnancy, unspecified control: Secondary | ICD-10-CM

## 2023-06-05 MED ORDER — LANCETS MISC. MISC: 1.0000 | Freq: Three times a day (TID) | 0 refills | Status: AC

## 2023-06-05 MED ORDER — LANCETS MISC: 1.0000 [IU] | Freq: Four times a day (QID) | 12 refills | Status: AC

## 2023-06-05 MED ORDER — BLOOD GLUCOSE MONITORING SUPPL DEVI: 1.0000 | Freq: Three times a day (TID) | 0 refills | Status: AC

## 2023-06-05 MED ORDER — BLOOD GLUCOSE TEST VI STRP: 1.0000 | ORAL_STRIP | Freq: Three times a day (TID) | 0 refills | Status: AC

## 2023-06-05 MED ORDER — LANCET DEVICE MISC: 1.0000 | Freq: Three times a day (TID) | 0 refills | Status: AC

## 2023-06-05 NOTE — Telephone Encounter (Signed)
 Call to client to notify her of abnormal 3 hour GTT results and need for transfer of care to [facility] due to diabetes. Counseled may pick up no cost diabetic kit at [facility] (hours provided) to take with her to [facility] appt as will be taught how to obtain blood sugars.Client aware to keep [facility] and [facility]  appts unless seen at [facility] before those appts. If seen at [facility] first, aware [facility] related appts will be cancelled. Client verbalized understanding of above. Burnadette Lowers, RN

## 2023-06-10 ENCOUNTER — Telehealth: Payer: Self-pay

## 2023-06-10 NOTE — Telephone Encounter (Signed)
Fax received from [facility] stating 3 unsuccessful attempts to notify client of ambulatory referral to MFM (transfer of care due to diabetes). Call to client and notified of above. Client given [facility] number to call and schedule appt. Client agrees to call [facility] for appt this am. Jossie Ng, RN

## 2023-06-10 NOTE — Telephone Encounter (Signed)
Per [facility], client has following appts now scheduled: 06/19/23 1100 [place] at [facility], followed by 1300 MD appt at [facility]. Also has new video appt with RD/LDN on 06/20/23 at 1100. As client has above appts, call to [facility] MFM scheduler and 06/12/23 [place] / consult appt cancelled. [facility] RV appt for 06/23/23 also cancelled. Call to client and let message notifying her of above cancellations as now has [facility] appts and to notify [facility] for questions. Number to call provided. Jossie Ng, RN

## 2023-06-12 ENCOUNTER — Ambulatory Visit: Payer: Self-pay

## 2023-06-23 ENCOUNTER — Ambulatory Visit: Payer: Self-pay

## 2023-07-15 NOTE — Addendum Note (Signed)
 Addended by: Heywood Bene on: 07/15/2023 01:43 PM   Modules accepted: Orders
